# Patient Record
Sex: Male | Born: 1945 | Race: Black or African American | Hispanic: No | Marital: Married | Smoking: Former smoker
Health system: Southern US, Community
[De-identification: ages and names within clinical notes are randomized; demographics above are authoritative.]

## PROBLEM LIST (undated history)

## (undated) DIAGNOSIS — I10 Essential (primary) hypertension: Secondary | ICD-10-CM

## (undated) DIAGNOSIS — I2510 Atherosclerotic heart disease of native coronary artery without angina pectoris: Secondary | ICD-10-CM

## (undated) DIAGNOSIS — B9562 Methicillin resistant Staphylococcus aureus infection as the cause of diseases classified elsewhere: Secondary | ICD-10-CM

## (undated) DIAGNOSIS — I509 Heart failure, unspecified: Secondary | ICD-10-CM

## (undated) DIAGNOSIS — I252 Old myocardial infarction: Secondary | ICD-10-CM

## (undated) DIAGNOSIS — N400 Enlarged prostate without lower urinary tract symptoms: Secondary | ICD-10-CM

## (undated) DIAGNOSIS — R7881 Bacteremia: Secondary | ICD-10-CM

## (undated) HISTORY — PX: OTHER SURGICAL HISTORY: SHX169

---

## 2015-06-29 ENCOUNTER — Emergency Department: Payer: Medicare HMO

## 2015-06-29 ENCOUNTER — Inpatient Hospital Stay
Admission: EM | Admit: 2015-06-29 | Discharge: 2015-07-03 | DRG: 698 | Disposition: A | Discharge status: Home / Self Care | Payer: Medicare HMO | Attending: Internal Medicine | Admitting: Internal Medicine

## 2015-06-29 ENCOUNTER — Encounter: Payer: Self-pay | Admitting: *Deleted

## 2015-06-29 ENCOUNTER — Inpatient Hospital Stay: Payer: Medicare HMO

## 2015-06-29 DIAGNOSIS — I5042 Chronic combined systolic (congestive) and diastolic (congestive) heart failure: Secondary | ICD-10-CM

## 2015-06-29 DIAGNOSIS — I10 Essential (primary) hypertension: Secondary | ICD-10-CM | POA: Diagnosis present

## 2015-06-29 DIAGNOSIS — Z87891 Personal history of nicotine dependence: Secondary | ICD-10-CM | POA: Diagnosis not present

## 2015-06-29 DIAGNOSIS — Z7982 Long term (current) use of aspirin: Secondary | ICD-10-CM

## 2015-06-29 DIAGNOSIS — Z978 Presence of other specified devices: Secondary | ICD-10-CM

## 2015-06-29 DIAGNOSIS — R339 Retention of urine, unspecified: Secondary | ICD-10-CM | POA: Diagnosis present

## 2015-06-29 DIAGNOSIS — A4151 Sepsis due to Escherichia coli [E. coli]: Secondary | ICD-10-CM | POA: Diagnosis present

## 2015-06-29 DIAGNOSIS — R740 Nonspecific elevation of levels of transaminase and lactic acid dehydrogenase [LDH]: Secondary | ICD-10-CM | POA: Diagnosis present

## 2015-06-29 DIAGNOSIS — N401 Enlarged prostate with lower urinary tract symptoms: Secondary | ICD-10-CM | POA: Diagnosis present

## 2015-06-29 DIAGNOSIS — N179 Acute kidney failure, unspecified: Secondary | ICD-10-CM

## 2015-06-29 DIAGNOSIS — I248 Other forms of acute ischemic heart disease: Secondary | ICD-10-CM | POA: Diagnosis present

## 2015-06-29 DIAGNOSIS — I429 Cardiomyopathy, unspecified: Secondary | ICD-10-CM | POA: Diagnosis present

## 2015-06-29 DIAGNOSIS — J9600 Acute respiratory failure, unspecified whether with hypoxia or hypercapnia: Secondary | ICD-10-CM

## 2015-06-29 DIAGNOSIS — T83511A Infection and inflammatory reaction due to indwelling urethral catheter, initial encounter: Principal | ICD-10-CM | POA: Diagnosis present

## 2015-06-29 DIAGNOSIS — R6520 Severe sepsis without septic shock: Secondary | ICD-10-CM | POA: Diagnosis present

## 2015-06-29 DIAGNOSIS — N10 Acute tubulo-interstitial nephritis: Secondary | ICD-10-CM | POA: Diagnosis present

## 2015-06-29 DIAGNOSIS — Z888 Allergy status to other drugs, medicaments and biological substances status: Secondary | ICD-10-CM | POA: Diagnosis not present

## 2015-06-29 DIAGNOSIS — I959 Hypotension, unspecified: Secondary | ICD-10-CM | POA: Diagnosis present

## 2015-06-29 DIAGNOSIS — N390 Urinary tract infection, site not specified: Secondary | ICD-10-CM

## 2015-06-29 DIAGNOSIS — Z79899 Other long term (current) drug therapy: Secondary | ICD-10-CM

## 2015-06-29 DIAGNOSIS — E860 Dehydration: Secondary | ICD-10-CM | POA: Diagnosis present

## 2015-06-29 DIAGNOSIS — N400 Enlarged prostate without lower urinary tract symptoms: Secondary | ICD-10-CM

## 2015-06-29 DIAGNOSIS — R197 Diarrhea, unspecified: Secondary | ICD-10-CM | POA: Diagnosis present

## 2015-06-29 DIAGNOSIS — Z960 Presence of urogenital implants: Secondary | ICD-10-CM

## 2015-06-29 DIAGNOSIS — A419 Sepsis, unspecified organism: Secondary | ICD-10-CM | POA: Diagnosis present

## 2015-06-29 DIAGNOSIS — Z882 Allergy status to sulfonamides status: Secondary | ICD-10-CM | POA: Diagnosis not present

## 2015-06-29 DIAGNOSIS — N139 Obstructive and reflux uropathy, unspecified: Secondary | ICD-10-CM | POA: Diagnosis present

## 2015-06-29 DIAGNOSIS — N170 Acute kidney failure with tubular necrosis: Secondary | ICD-10-CM | POA: Diagnosis present

## 2015-06-29 DIAGNOSIS — R319 Hematuria, unspecified: Secondary | ICD-10-CM

## 2015-06-29 DIAGNOSIS — R338 Other retention of urine: Secondary | ICD-10-CM | POA: Diagnosis present

## 2015-06-29 HISTORY — DX: Essential (primary) hypertension: I10

## 2015-06-29 HISTORY — DX: Heart failure, unspecified: I50.9

## 2015-06-29 HISTORY — DX: Benign prostatic hyperplasia without lower urinary tract symptoms: N40.0

## 2015-06-29 LAB — URINALYSIS COMPLETE WITH MICROSCOPIC (ARMC ONLY)
BILIRUBIN URINE: NEGATIVE
GLUCOSE, UA: NEGATIVE mg/dL
NITRITE: NEGATIVE
PH: 5 (ref 5.0–8.0)
Protein, ur: >500 mg/dL — AB
Specific Gravity, Urine: 1.024 (ref 1.005–1.030)

## 2015-06-29 LAB — BLOOD CULTURE ID PANEL (REFLEXED)
ACINETOBACTER BAUMANNII: NOT DETECTED
CANDIDA PARAPSILOSIS: NOT DETECTED
CANDIDA TROPICALIS: NOT DETECTED
Candida albicans: NOT DETECTED
Candida glabrata: NOT DETECTED
Candida krusei: NOT DETECTED
Carbapenem resistance: NOT DETECTED
Enterobacter cloacae complex: NOT DETECTED
Enterobacteriaceae species: DETECTED — AB
Enterococcus species: NOT DETECTED
Escherichia coli: DETECTED — AB
HAEMOPHILUS INFLUENZAE: NOT DETECTED
KLEBSIELLA OXYTOCA: NOT DETECTED
KLEBSIELLA PNEUMONIAE: NOT DETECTED
Listeria monocytogenes: NOT DETECTED
METHICILLIN RESISTANCE: NOT DETECTED
Neisseria meningitidis: NOT DETECTED
PROTEUS SPECIES: NOT DETECTED
Pseudomonas aeruginosa: NOT DETECTED
SERRATIA MARCESCENS: NOT DETECTED
Staphylococcus aureus (BCID): NOT DETECTED
Staphylococcus species: NOT DETECTED
Streptococcus agalactiae: NOT DETECTED
Streptococcus pneumoniae: NOT DETECTED
Streptococcus pyogenes: NOT DETECTED
Streptococcus species: NOT DETECTED
VANCOMYCIN RESISTANCE: NOT DETECTED

## 2015-06-29 LAB — COMPREHENSIVE METABOLIC PANEL
ALBUMIN: 3.9 g/dL (ref 3.5–5.0)
ALT: 21 U/L (ref 17–63)
AST: 24 U/L (ref 15–41)
Alkaline Phosphatase: 70 U/L (ref 38–126)
Anion gap: 10 (ref 5–15)
BILIRUBIN TOTAL: 1 mg/dL (ref 0.3–1.2)
BUN: 31 mg/dL — AB (ref 6–20)
CO2: 24 mmol/L (ref 22–32)
Calcium: 9.2 mg/dL (ref 8.9–10.3)
Chloride: 104 mmol/L (ref 101–111)
Creatinine, Ser: 1.73 mg/dL — ABNORMAL HIGH (ref 0.61–1.24)
GFR calc Af Amer: 44 mL/min — ABNORMAL LOW (ref >60)
GFR calc non Af Amer: 38 mL/min — ABNORMAL LOW (ref >60)
GLUCOSE: 103 mg/dL — AB (ref 65–99)
POTASSIUM: 4.5 mmol/L (ref 3.5–5.1)
Sodium: 138 mmol/L (ref 135–145)
TOTAL PROTEIN: 7.7 g/dL (ref 6.5–8.1)

## 2015-06-29 LAB — CBC WITH DIFFERENTIAL/PLATELET
BASOS ABS: 0 10*3/uL (ref 0–0.1)
Basophils Relative: 0 %
EOS PCT: 2 %
Eosinophils Absolute: 0.1 10*3/uL (ref 0–0.7)
HCT: 48.8 % (ref 40.0–52.0)
Hemoglobin: 16.4 g/dL (ref 13.0–18.0)
LYMPHS ABS: 0.7 10*3/uL — AB (ref 1.0–3.6)
LYMPHS PCT: 16 %
MCH: 29.7 pg (ref 26.0–34.0)
MCHC: 33.6 g/dL (ref 32.0–36.0)
MCV: 88.6 fL (ref 80.0–100.0)
MONO ABS: 0 10*3/uL — AB (ref 0.2–1.0)
Monocytes Relative: 1 %
Neutro Abs: 3.8 10*3/uL (ref 1.4–6.5)
Neutrophils Relative %: 81 %
PLATELETS: 220 10*3/uL (ref 150–440)
RBC: 5.5 MIL/uL (ref 4.40–5.90)
RDW: 14.4 % (ref 11.5–14.5)
WBC: 4.7 10*3/uL (ref 3.8–10.6)

## 2015-06-29 LAB — LACTIC ACID, PLASMA
LACTIC ACID, VENOUS: 5.7 mmol/L — AB (ref 0.5–2.0)
Lactic Acid, Venous: 3.9 mmol/L (ref 0.5–2.0)
Lactic Acid, Venous: 4.2 mmol/L (ref 0.5–2.0)
Lactic Acid, Venous: 5.2 mmol/L (ref 0.5–2.0)

## 2015-06-29 LAB — PROTIME-INR
INR: 1.11
Prothrombin Time: 14.5 seconds (ref 11.4–15.0)

## 2015-06-29 LAB — LIPASE, BLOOD: LIPASE: 25 U/L (ref 11–51)

## 2015-06-29 LAB — TROPONIN I: TROPONIN I: 0.04 ng/mL — AB (ref <0.031)

## 2015-06-29 LAB — APTT

## 2015-06-29 MED ORDER — SODIUM CHLORIDE 0.9 % IV BOLUS (SEPSIS)
1000.0000 mL | Freq: Once | INTRAVENOUS | Status: AC
  Administered 2015-06-29: 1000 mL via INTRAVENOUS

## 2015-06-29 MED ORDER — SODIUM CHLORIDE 0.9 % IV BOLUS (SEPSIS)
1000.0000 mL | INTRAVENOUS | Status: AC
  Administered 2015-06-29 (×2): 1000 mL via INTRAVENOUS

## 2015-06-29 MED ORDER — SODIUM CHLORIDE 0.9 % IV BOLUS (SEPSIS)
1000.0000 mL | Freq: Once | INTRAVENOUS | Status: AC
Start: 2015-06-29 — End: 2015-06-29
  Administered 2015-06-29: 1000 mL via INTRAVENOUS

## 2015-06-29 MED ORDER — SODIUM CHLORIDE 0.9 % IV BOLUS (SEPSIS)
1000.0000 mL | INTRAVENOUS | Status: AC
  Administered 2015-06-29 (×3): 1000 mL via INTRAVENOUS

## 2015-06-29 MED ORDER — PIPERACILLIN-TAZOBACTAM 3.375 G IVPB: 3.3750 g | Freq: Three times a day (TID) | INTRAVENOUS | Status: DC

## 2015-06-29 MED ORDER — DEXTROSE 5 % IV SOLN: 2.0000 g | Freq: Once | INTRAVENOUS | Status: DC

## 2015-06-29 MED ORDER — VANCOMYCIN HCL 10 G IV SOLR
1500.0000 mg | Freq: Once | INTRAVENOUS | Status: AC
  Administered 2015-06-29: 1500 mg via INTRAVENOUS
  Filled 2015-06-29: qty 1500

## 2015-06-29 MED ORDER — ACETAMINOPHEN 500 MG PO TABS
1000.0000 mg | ORAL_TABLET | Freq: Once | ORAL | Status: AC
  Administered 2015-06-29: 1000 mg via ORAL
  Filled 2015-06-29: qty 2

## 2015-06-29 MED ORDER — TAMSULOSIN HCL 0.4 MG PO CAPS
0.4000 mg | ORAL_CAPSULE | Freq: Every day | ORAL | Status: DC
  Administered 2015-06-29 – 2015-07-03 (×5): 0.4 mg via ORAL
  Filled 2015-06-29 (×5): qty 1

## 2015-06-29 MED ORDER — PIPERACILLIN-TAZOBACTAM 3.375 G IVPB
3.3750 g | Freq: Once | INTRAVENOUS | Status: AC
  Administered 2015-06-29: 3.375 g via INTRAVENOUS
  Filled 2015-06-29: qty 50

## 2015-06-29 MED ORDER — SODIUM CHLORIDE 0.9 % IV SOLN
2.0000 g | Freq: Two times a day (BID) | INTRAVENOUS | Status: DC
  Administered 2015-06-29 – 2015-07-01 (×4): 2 g via INTRAVENOUS
  Filled 2015-06-29 (×6): qty 2

## 2015-06-29 MED ORDER — DEXTROSE 5 % IV SOLN
1.0000 g | INTRAVENOUS | Status: AC
  Administered 2015-06-29: 1 g via INTRAVENOUS
  Filled 2015-06-29: qty 10

## 2015-06-29 MED ORDER — ASPIRIN EC 81 MG PO TBEC
81.0000 mg | DELAYED_RELEASE_TABLET | Freq: Every day | ORAL | Status: DC
Start: 2015-06-29 — End: 2015-07-03
  Administered 2015-06-29 – 2015-07-03 (×5): 81 mg via ORAL
  Filled 2015-06-29 (×5): qty 1

## 2015-06-29 MED ORDER — ONDANSETRON HCL 4 MG/2ML IJ SOLN
INTRAMUSCULAR | Status: AC
  Administered 2015-06-29: 4 mg via INTRAVENOUS
  Filled 2015-06-29: qty 2

## 2015-06-29 MED ORDER — ADULT MULTIVITAMIN W/MINERALS CH
1.0000 | ORAL_TABLET | Freq: Every day | ORAL | Status: DC
  Administered 2015-06-29 – 2015-07-03 (×5): 1 via ORAL
  Filled 2015-06-29 (×5): qty 1

## 2015-06-29 MED ORDER — PHENYLEPHRINE HCL 10 MG/ML IJ SOLN
0.0000 ug/min | INTRAVENOUS | Status: DC
  Filled 2015-06-29 (×2): qty 1

## 2015-06-29 MED ORDER — DOCUSATE SODIUM 100 MG PO CAPS
100.0000 mg | ORAL_CAPSULE | Freq: Two times a day (BID) | ORAL | Status: DC
  Administered 2015-06-29: 100 mg via ORAL
  Filled 2015-06-29: qty 1

## 2015-06-29 MED ORDER — IBUPROFEN 800 MG PO TABS
800.0000 mg | ORAL_TABLET | Freq: Once | ORAL | Status: AC
  Administered 2015-06-29: 800 mg via ORAL
  Filled 2015-06-29: qty 1

## 2015-06-29 MED ORDER — SODIUM CHLORIDE 0.9 % IV SOLN
INTRAVENOUS | Status: DC
  Administered 2015-06-29 – 2015-07-01 (×3): via INTRAVENOUS

## 2015-06-29 MED ORDER — ACETAMINOPHEN 650 MG RE SUPP
650.0000 mg | Freq: Four times a day (QID) | RECTAL | Status: DC | PRN
Start: 2015-06-29 — End: 2015-07-03

## 2015-06-29 MED ORDER — ONDANSETRON HCL 4 MG/2ML IJ SOLN
INTRAMUSCULAR | Status: AC
  Filled 2015-06-29: qty 2

## 2015-06-29 MED ORDER — PIPERACILLIN-TAZOBACTAM 3.375 G IVPB 30 MIN
3.3750 g | Freq: Once | INTRAVENOUS | Status: AC
  Administered 2015-06-29: 3.375 g via INTRAVENOUS
  Filled 2015-06-29: qty 50

## 2015-06-29 MED ORDER — ONDANSETRON HCL 4 MG PO TABS: 4.0000 mg | ORAL_TABLET | Freq: Four times a day (QID) | ORAL | Status: DC | PRN

## 2015-06-29 MED ORDER — DEXTROSE 5 % IV SOLN
1.0000 g | Freq: Once | INTRAVENOUS | Status: AC
  Administered 2015-06-29: 1 g via INTRAVENOUS
  Filled 2015-06-29: qty 10

## 2015-06-29 MED ORDER — SODIUM CHLORIDE 0.9 % IJ SOLN
3.0000 mL | Freq: Two times a day (BID) | INTRAMUSCULAR | Status: DC
  Administered 2015-06-29 – 2015-07-03 (×8): 3 mL via INTRAVENOUS

## 2015-06-29 MED ORDER — ENOXAPARIN SODIUM 40 MG/0.4ML ~~LOC~~ SOLN
40.0000 mg | SUBCUTANEOUS | Status: DC
  Administered 2015-06-29: 40 mg via SUBCUTANEOUS
  Filled 2015-06-29: qty 0.4

## 2015-06-29 MED ORDER — ONDANSETRON HCL 4 MG/2ML IJ SOLN
4.0000 mg | Freq: Once | INTRAMUSCULAR | Status: AC
  Administered 2015-06-29: 4 mg via INTRAVENOUS

## 2015-06-29 MED ORDER — ONDANSETRON HCL 4 MG/2ML IJ SOLN: 4.0000 mg | Freq: Four times a day (QID) | INTRAMUSCULAR | Status: DC | PRN

## 2015-06-29 MED ORDER — VANCOMYCIN HCL 10 G IV SOLR
1250.0000 mg | INTRAVENOUS | Status: DC
  Filled 2015-06-29: qty 1250

## 2015-06-29 MED ORDER — ACETAMINOPHEN 325 MG PO TABS: 650.0000 mg | ORAL_TABLET | Freq: Four times a day (QID) | ORAL | Status: DC | PRN

## 2015-06-29 MED ORDER — SODIUM CHLORIDE 0.9 % IV SOLN
1500.0000 mg | Freq: Two times a day (BID) | INTRAVENOUS | Status: DC
Start: 2015-06-29 — End: 2015-06-29
  Filled 2015-06-29: qty 1500

## 2015-06-29 NOTE — Progress Notes (Addendum)
ANTIBIOTIC CONSULT NOTE - INITIAL  Pharmacy Consult for ceftriaxone Indication: UTI  Allergies  Allergen Reactions  . Bactrim [Sulfamethoxazole-Trimethoprim] Hives  . Sulfa Antibiotics Hives    Patient Measurements: Height:  (190.5 cm) Weight: 210 lb (95.255 kg) IBW/kg (Calculated) : 84.5 Adjusted Body Weight:   Vital Signs: Temp: 101.1 F (38.4 C) (01/19 0243) Temp Source: Oral (01/19 0243) BP: 115/68 mmHg (01/19 0243) Pulse Rate: 115 (01/19 0251) Intake/Output from previous day:   Intake/Output from this shift:    Labs: No results for input(s): WBC, HGB, PLT, LABCREA, CREATININE in the last 72 hours. CrCl cannot be calculated (Patient has no serum creatinine result on file.). No results for input(s): VANCOTROUGH, VANCOPEAK, VANCORANDOM, GENTTROUGH, GENTPEAK, GENTRANDOM, TOBRATROUGH, TOBRAPEAK, TOBRARND, AMIKACINPEAK, AMIKACINTROU, AMIKACIN in the last 72 hours.   Microbiology: No results found for this or any previous visit (from the past 720 hour(s)).  Medical History: Past Medical History  Diagnosis Date  . Hypertension   . BPH (benign prostatic hyperplasia)   . CHF (congestive heart failure) (HCC)     Medications:  Infusions:  . cefTRIAXone (ROCEPHIN)  IV    . sodium chloride     Assessment: 69 yom with fever N/V. Pharmacy consulted to dose ceftriaxone for UTI.   Goal of Therapy:    Plan:  Ceftriaxone 1 gm IV Q24H.   Carola Frost, Pharm.D., BCPS Clinical Pharmacist 06/29/2015,3:07 AM   Update [phone] 06/29/2015    Order to start Zosyn and vancomycin for sepsis received. Vd 62.2 L, Ke 0.045 hr-1, T1/2 15.3 hr. Zosyn 3.375 gm IV Q8H EI and vancomycin 1.5 gm IV Q18H with stacked dosing, second dose approximately 12 hours after first, predicted trough 19 mcg/mL. Pharmacy will continue to follow and adjust as needed to maintain trough 15 to 20 mcg/mL.

## 2015-06-29 NOTE — Progress Notes (Signed)
Pharmacy Antibiotic Follow-up Note  Casey Murphy is a 70 y.o. year-old male admitted on 06/29/2015.  The patient is currently on day 1 of vancomycin and Zosyn for UTI/sepsis.  Assessment/Plan: After discussion with Dr. Cherlynn Kaiser, BCID results were discussed and patient will be narrowed to meropenem. Further narrowing will be determined based on finalized susceptibilities.   Will order meropenem 2 g IV q12h (renally adjusted from meropenem 2 g IV q8h per [employer] protocol).    Temp (24hrs), Avg:101.6 F (38.7 C), Min:99.4 F (37.4 C), Max:103.2 F (39.6 C)   Recent Labs Lab 06/29/15 0301  WBC 4.7    Recent Labs Lab 06/29/15 0301  CREATININE 1.73*   Estimated Creatinine Clearance: 46.8 mL/min (by C-G formula based on Cr of 1.73).    Allergies  Allergen Reactions  . Lisinopril Other (See Comments)    Reaction: unknown  . Sulfa Antibiotics Hives    Antimicrobials this admission: Vancomycin/Zosyn 1/19>>1/19 Meropenem 1/19>>    Microbiology results: 1/19 BCx: GNR/Ecoli 1/19 UCx: pending  Thank you for allowing pharmacy to be a part of this patient's care.  Marty Heck PharmD 06/29/2015 3:11 PM

## 2015-06-29 NOTE — Progress Notes (Addendum)
eLink Physician-Brief Progress Note Patient Name: Casey Murphy DOB: 17-Jul-1945 MRN: [id number]   Date of Service  06/29/2015  HPI/Events of Note  70 yo male with PMH of HTN, Systolic CHF, BPH. Admitted with cystitis and sepsis. Gram negative rods in urine. Lactic Acid 4.2 >> 5.7. Current medical regimen includes Meropenem, ASA, Colace, MVI and Flomax. Already given a Fluid bolus for the hypotension and elevated Lactic Acid. Management per the Hospitalist.   eICU Interventions  Continue present management. Trend BP and Lactic Acid levels.      Intervention Category Evaluation Type: New Patient Evaluation  Lenell Antu 06/29/2015, 8:01 PM

## 2015-06-29 NOTE — Progress Notes (Signed)
[facility] [place] at [facility]   PATIENT NAME: Casey Murphy    MR#:  [id number]  DATE OF BIRTH:  1945-08-11  SUBJECTIVE:   Patient here due to fever, hypotension and noted to be septic secondary to UTI. Complaining of some abdominal bloating and fullness. Blood pressure still on the low side. Patient's blood, urine cultures are positive for gram-negative rod.  REVIEW OF SYSTEMS:    Review of Systems  Constitutional: Negative for fever and chills.  HENT: Negative for congestion and tinnitus.   Eyes: Negative for blurred vision and double vision.  Respiratory: Negative for cough, shortness of breath and wheezing.   Cardiovascular: Negative for chest pain, orthopnea and PND.  Gastrointestinal: Negative for nausea, vomiting, abdominal pain and diarrhea.  Genitourinary: Negative for dysuria and hematuria.  Neurological: Positive for weakness (generalized). Negative for dizziness, sensory change and focal weakness.  All other systems reviewed and are negative.   Nutrition: Heart healthy Tolerating Diet: Yes Tolerating PT: Await evaluation   DRUG ALLERGIES:   Allergies  Allergen Reactions  . Lisinopril Other (See Comments)    Reaction: unknown  . Sulfa Antibiotics Hives    VITALS:  Blood pressure 81/58, pulse 102, temperature 98.1 F (36.7 C), temperature source Oral, resp. rate 26, height  (1.905 m), weight 95.255 kg (210 lb), SpO2 94 %.  PHYSICAL EXAMINATION:   Physical Exam  GENERAL:  70 y.o.-year-old patient lying in the bed with no acute distress.  EYES: Pupils equal, round, reactive to light and accommodation. No scleral icterus. Extraocular muscles intact.  HEENT: Head atraumatic, normocephalic. Oropharynx and nasopharynx clear.  NECK:  Supple, no jugular venous distention. No thyroid enlargement, no tenderness.  LUNGS: Normal breath sounds bilaterally, no wheezing, rales, rhonchi. No use of accessory muscles of respiration.   CARDIOVASCULAR: S1, S2 normal. No murmurs, rubs, or gallops.  ABDOMEN: Soft, nontender, nondistended. Bowel sounds present. No organomegaly or mass.  EXTREMITIES: No cyanosis, clubbing or edema b/l.    NEUROLOGIC: Cranial nerves II through XII are intact. No focal Motor or sensory deficits b/l.   PSYCHIATRIC: The patient is alert and oriented x 3.  SKIN: No obvious rash, lesion, or ulcer.   Foley catheter in place with tea colored Urine draining.  LABORATORY PANEL:   CBC  Recent Labs Lab 06/29/15 0301  WBC 4.7  HGB 16.4  HCT 48.8  PLT 220   ------------------------------------------------------------------------------------------------------------------  Chemistries   Recent Labs Lab 06/29/15 0301  NA 138  K 4.5  CL 104  CO2 24  GLUCOSE 103*  BUN 31*  CREATININE 1.73*  CALCIUM 9.2  AST 24  ALT 21  ALKPHOS 70  BILITOT 1.0   ------------------------------------------------------------------------------------------------------------------  Cardiac Enzymes  Recent Labs Lab 06/29/15 0301  TROPONINI 0.04*   ------------------------------------------------------------------------------------------------------------------  RADIOLOGY:  Dg Chest Port 1 View  06/29/2015  CLINICAL DATA:  Acute onset of fever, nausea and vomiting. Recent Foley catheter placement. Initial encounter. EXAM: PORTABLE CHEST 1 VIEW COMPARISON:  None. FINDINGS: The lungs are well-aerated. Mild bibasilar atelectasis is noted. There is no evidence of pleural effusion or pneumothorax. The cardiomediastinal silhouette is within normal limits. No acute osseous abnormalities are seen. IMPRESSION: Mild bibasilar atelectasis noted.  Lungs otherwise clear. Electronically Signed   By: Roanna Raider M.D.   On: 06/29/2015 03:16   Ct Renal Stone Study  06/29/2015  CLINICAL DATA:  70 year old male with urinary retention and sepsis and hematuria. EXAM: CT ABDOMEN AND PELVIS WITHOUT CONTRAST TECHNIQUE:  Multidetector CT  imaging of the abdomen and pelvis was performed following the standard protocol without IV contrast. COMPARISON:  None. FINDINGS: Evaluation of this exam is limited in the absence of intravenous contrast. Minimal bibasilar dependent atelectatic changes. The visualized lung bases are otherwise clear. No intra-abdominal free air or free fluid. Multiple hepatic hypodense lesions measuring up to 1.8 cm in the left lobe of the liver. This lesions are incompletely characterized but may represent or hemangioma. Nonemergent MRI or dedicated multiphasic contrast enhanced CT is recommended for further characterization. The gallbladder, pancreas, spleen, and the right adrenal gland appear unremarkable. There is a 12 mm indeterminate left adrenal nodule, likely an adenoma. There is no hydronephrosis on either side. Punctate nonobstructing right renal calculus may be present. Subcentimeter ill-defined left renal parenchymal hypodense lesion is not characterized on this noncontrast study but may represent cysts. Mild nonspecific bilateral perinephric stranding noted. Correlation with urinalysis recommended to exclude UTI. The visualized ureters appear unremarkable. The urinary bladder is decompressed around a Foley catheter. The prostate gland is enlarged measuring 8.6 cm in transverse diameter. There are scattered sigmoid diverticula without active inflammatory changes. Moderate stool throughout the colon. No evidence of bowel obstruction or inflammation. Normal appendix. There is aortoiliac atherosclerotic disease. There is a 2.6 cm infrarenal aortic ectasia. No portal venous gas identified. There is no adenopathy. The abdominal wall soft tissues appear unremarkable. There is degenerative changes of the spine. No acute fracture. IMPRESSION: No hydronephrosis or obstructing stone. Correlation with urinalysis recommended to exclude UTI. Enlarged prostate gland. No evidence of bowel obstruction or inflammation.   Normal appendix. Sigmoid diverticulosis. Small scattered hepatic hypodense lesions. Electronically Signed   By: Elgie Collard M.D.   On: 06/29/2015 04:20     ASSESSMENT AND PLAN:   70 yo male w/ hx of HTN, hx of systolic CHF, BPH w/ urinary retention who presented to the hospital w/ fever and noted to have sepsis secondary to UTI.  #1 sepsis-patient presented with fever, tachycardia, hypotension, positive urinalysis and elevated lactic acid. -Continue aggressive IV fluids, IV meropenem, patient's blood and urine cultures are positive for gram-negative rod and will follow-up identification.  #2 UTI-this is the cause of patient's sepsis. -Continue IV meropenem, follow urine, blood cultures.  #3 hypotension-secondary to the sepsis and UTI. -Continue IV fluids and follow hemodynamics. Patient has a history of CHF and therefore will need to be careful with IV fluid hydration.  #4 history of BPH with urinary retention-patient presented to the hospital with a chronic indwelling Foley. -I will get a urology consult. Continue Flomax.  #5 history of CHF-the patient is not in congestive heart failure presently. -Hold beta blockers given the hypotension, hold diuretics.  #6 elevated troponin-likely secondary to demand ischemia from hypotension and sepsis. -Follow serial cardiac markers.     All the records are reviewed and case discussed with Care Management/Social Workerr. Management plans discussed with the patient, family and they are in agreement.  CODE STATUS: Full  DVT Prophylaxis: Lovenox  TOTAL TIME TAKING CARE OF THIS PATIENT: 30 minutes.   POSSIBLE D/C IN 2-3 DAYS, DEPENDING ON CLINICAL CONDITION.   Houston Siren M.D on 06/29/2015 at 5:14 PM  Between 7am to 6pm - Pager - [phone]  After 6pm go to [email or web address] - password [email or web address] [facility]  [facility]  Office  [phone]  CC: Primary care physician; No primary care provider on file.

## 2015-06-29 NOTE — H&P (Addendum)
[facility] - [place] at [facility]   PATIENT NAME: Casey Murphy    MR#:  [id number]  DATE OF BIRTH:  05-25-1946  DATE OF ADMISSION:  06/29/2015  PRIMARY CARE PHYSICIAN: No primary care provider on file.   REQUESTING/REFERRING PHYSICIAN:   CHIEF COMPLAINT:   Chief Complaint  Patient presents with  . Fever  . Code Sepsis    HISTORY OF PRESENT ILLNESS: Casey Murphy  is a 70 y.o. male with a known history of benign prostatic hypertrophy, hypertension, congestive heart failure presented to the emergency room with fever. Patient had a fever of 102.74F and he presented to the ER. Her had some chills and low back pain. [facility] patient had a Foley catheter placed 4 days ago for urinary obstruction secondary to prostate hypertrophy. Patient was admitted to the emergency room and was found to be septic and was started on IV antibiotics and IV fluids. He was also hypotensive and received 3 L of IV fluid hydration. No history of any chest pain. No complaints of any shortness of breath. No headache dizziness or blurry vision. Has generalized weakness. Initially when patient presented to the emergency room he was more lethargic and later on he became more alert and awake and responding to all verbal commands.  PAST MEDICAL HISTORY:   Past Medical History  Diagnosis Date  . Hypertension   . BPH (benign prostatic hyperplasia)   . CHF (congestive heart failure) (HCC)     PAST SURGICAL HISTORY: Past Surgical History  Procedure Laterality Date  . None      SOCIAL HISTORY:  Social History  Substance Use Topics  . Smoking status: Former Smoker    Types: Cigarettes    Quit date: 05/29/2015  . Smokeless tobacco: Never Used  . Alcohol Use: 0.0 oz/week    0 Standard drinks or equivalent per week     Comment: wine occasionally    FAMILY HISTORY: History reviewed. No pertinent family history.  DRUG ALLERGIES:   Allergies  Allergen Reactions  . Lisinopril Other (See Comments)    Reaction: unknown  . Sulfa Antibiotics Hives    REVIEW OF SYSTEMS:   CONSTITUTIONAL: Has fever, fatigue and weakness.  EYES: No blurred or double vision.  EARS, NOSE, AND THROAT: No tinnitus or ear pain.  RESPIRATORY: No cough, shortness of breath, wheezing or hemoptysis.  CARDIOVASCULAR: No chest pain, orthopnea, edema.  GASTROINTESTINAL: No nausea, vomiting, diarrhea or abdominal pain.  GENITOURINARY: Has dysuria, hematuria.  ENDOCRINE: No polyuria, nocturia,  HEMATOLOGY: No anemia, easy bruising or bleeding SKIN: No rash or lesion. MUSCULOSKELETAL: No joint pain or arthritis. Low back pain present  NEUROLOGIC: No tingling, numbness, weakness.  PSYCHIATRY: No anxiety or depression.   MEDICATIONS AT HOME:  Prior to Admission medications   Medication Sig Start Date End Date Taking? Authorizing Provider  aspirin EC 81 MG tablet Take 81 mg by mouth daily.   Yes Historical Provider, MD  losartan (COZAAR) 100 MG tablet Take 100 mg by mouth daily.   Yes Historical Provider, MD  metoprolol succinate (TOPROL-XL) 50 MG 24 hr tablet Take 50 mg by mouth daily. Take with or immediately following a meal.   Yes Historical Provider, MD  Multiple Vitamin (MULTIVITAMIN WITH MINERALS) TABS tablet Take 1 tablet by mouth daily.   Yes Historical Provider, MD  tamsulosin (FLOMAX) 0.4 MG CAPS capsule Take 0.4 mg by mouth daily.   Yes Historical Provider, MD  PHYSICAL EXAMINATION:   VITAL SIGNS: Blood pressure 103/60, pulse 117, temperature 102.7 F (39.3 C), temperature source Oral, resp. rate 18, height  (1.905 m), weight 95.255 kg (210 lb), SpO2 94 %.  GENERAL:  70 y.o.-year-old patient lying in the bed with no acute distress.  EYES: Pupils equal, round, reactive to light and accommodation. No scleral icterus. Extraocular muscles intact.  HEENT: Head atraumatic, normocephalic. Oropharynx dry and nasopharynx clear.   NECK:  Supple, no jugular venous distention. No thyroid enlargement, no tenderness.  LUNGS: Normal breath sounds bilaterally, no wheezing, rales,rhonchi or crepitation. No use of accessory muscles of respiration.  CARDIOVASCULAR: S1, S2 tachycardia noted. No murmurs, rubs, or gallops.  ABDOMEN: Soft, nontender, nondistended. Bowel sounds present. No organomegaly or mass. Foley noted. EXTREMITIES: No pedal edema, cyanosis, or clubbing.  NEUROLOGIC: Cranial nerves II through XII are intact. Muscle strength 5/5 in all extremities. Sensation intact. Gait not checked.  PSYCHIATRIC: The patient is alert and oriented x 3.  SKIN: No obvious rash, lesion, or ulcer.   LABORATORY PANEL:   CBC  Recent Labs Lab 06/29/15 0301  WBC 4.7  HGB 16.4  HCT 48.8  PLT 220  MCV 88.6  MCH 29.7  MCHC 33.6  RDW 14.4  LYMPHSABS 0.7*  MONOABS 0.0*  EOSABS 0.1  BASOSABS 0.0   ------------------------------------------------------------------------------------------------------------------  Chemistries   Recent Labs Lab 06/29/15 0301  NA 138  K 4.5  CL 104  CO2 24  GLUCOSE 103*  BUN 31*  CREATININE 1.73*  CALCIUM 9.2  AST 24  ALT 21  ALKPHOS 70  BILITOT 1.0   ------------------------------------------------------------------------------------------------------------------ estimated creatinine clearance is 46.8 mL/min (by C-G formula based on Cr of 1.73). ------------------------------------------------------------------------------------------------------------------ No results for input(s): TSH, T4TOTAL, T3FREE, THYROIDAB in the last 72 hours.  Invalid input(s): FREET3   Coagulation profile  Recent Labs Lab 06/29/15 0301  INR 1.11   ------------------------------------------------------------------------------------------------------------------- No results for input(s): DDIMER in the last 72  hours. -------------------------------------------------------------------------------------------------------------------  Cardiac Enzymes  Recent Labs Lab 06/29/15 0301  TROPONINI 0.04*   ------------------------------------------------------------------------------------------------------------------ Invalid input(s): POCBNP  ---------------------------------------------------------------------------------------------------------------  Urinalysis    Component Value Date/Time   COLORURINE AMBER* 06/29/2015 0329   APPEARANCEUR CLOUDY* 06/29/2015 0329   LABSPEC 1.024 06/29/2015 0329   PHURINE 5.0 06/29/2015 0329   GLUCOSEU NEGATIVE 06/29/2015 0329   HGBUR 3+* 06/29/2015 0329   BILIRUBINUR NEGATIVE 06/29/2015 0329   KETONESUR TRACE* 06/29/2015 0329   PROTEINUR >500* 06/29/2015 0329   NITRITE NEGATIVE 06/29/2015 0329   LEUKOCYTESUR 3+* 06/29/2015 0329     RADIOLOGY: Dg Chest Port 1 View  06/29/2015  CLINICAL DATA:  Acute onset of fever, nausea and vomiting. Recent Foley catheter placement. Initial encounter. EXAM: PORTABLE CHEST 1 VIEW COMPARISON:  None. FINDINGS: The lungs are well-aerated. Mild bibasilar atelectasis is noted. There is no evidence of pleural effusion or pneumothorax. The cardiomediastinal silhouette is within normal limits. No acute osseous abnormalities are seen. IMPRESSION: Mild bibasilar atelectasis noted.  Lungs otherwise clear. Electronically Signed   By: Roanna Raider M.D.   On: 06/29/2015 03:16   Ct Renal Stone Study  06/29/2015  CLINICAL DATA:  70 year old male with urinary retention and sepsis and hematuria. EXAM: CT ABDOMEN AND PELVIS WITHOUT CONTRAST TECHNIQUE: Multidetector CT imaging of the abdomen and pelvis was performed following the standard protocol without IV contrast. COMPARISON:  None. FINDINGS: Evaluation of this exam is limited in the absence of intravenous contrast. Minimal bibasilar dependent atelectatic changes. The visualized lung  bases are otherwise clear. No intra-abdominal  free air or free fluid. Multiple hepatic hypodense lesions measuring up to 1.8 cm in the left lobe of the liver. This lesions are incompletely characterized but may represent or hemangioma. Nonemergent MRI or dedicated multiphasic contrast enhanced CT is recommended for further characterization. The gallbladder, pancreas, spleen, and the right adrenal gland appear unremarkable. There is a 12 mm indeterminate left adrenal nodule, likely an adenoma. There is no hydronephrosis on either side. Punctate nonobstructing right renal calculus may be present. Subcentimeter ill-defined left renal parenchymal hypodense lesion is not characterized on this noncontrast study but may represent cysts. Mild nonspecific bilateral perinephric stranding noted. Correlation with urinalysis recommended to exclude UTI. The visualized ureters appear unremarkable. The urinary bladder is decompressed around a Foley catheter. The prostate gland is enlarged measuring 8.6 cm in transverse diameter. There are scattered sigmoid diverticula without active inflammatory changes. Moderate stool throughout the colon. No evidence of bowel obstruction or inflammation. Normal appendix. There is aortoiliac atherosclerotic disease. There is a 2.6 cm infrarenal aortic ectasia. No portal venous gas identified. There is no adenopathy. The abdominal wall soft tissues appear unremarkable. There is degenerative changes of the spine. No acute fracture. IMPRESSION: No hydronephrosis or obstructing stone. Correlation with urinalysis recommended to exclude UTI. Enlarged prostate gland. No evidence of bowel obstruction or inflammation.  Normal appendix. Sigmoid diverticulosis. Small scattered hepatic hypodense lesions. Electronically Signed   By: Elgie Collard M.D.   On: 06/29/2015 04:20    EKG: Orders placed or performed during the hospital encounter of 06/29/15  . EKG 12-Lead  . EKG 12-Lead  . EKG 12-Lead  .  EKG 12-Lead    IMPRESSION AND PLAN: 70 year old male patient with history of prostate hypertrophy, congestive heart failure presented to the emergency room with fever and low blood pressure. Patient has the indwelling Foley catheter which was placed 4 days ago for urinary obstruction secondary to prostate hypertrophy. Admitting diagnosis 1. Sepsis 2. Urinary tract infection 3. Hypotension 4. Dehydration 5. Prostate hypertrophy Treatment plan Admit patient to stepdown unit IV fluid resuscitation according to sepsis protocol IV vancomycin and IV Zosyn antibiotics and follow-up cultures Hold hypertensive medication Monitor electrolytes. IV pressors if no response to IV fluids.  All the records are reviewed and case discussed with ED provider. Management plans discussed with the patient, family and they are in agreement.  CODE STATUS:FULL    Code Status Orders        Start     Ordered   06/29/15 0601  Full code   Continuous     06/29/15 0602    Code Status History    Date Active Date Inactive Code Status Order ID Comments User Context   This patient has a current code status but no historical code status.       TOTAL CRITICAL CARETIME TAKING CARE OF THIS PATIENT: 52 minutes.    Ihor Austin M.D on 06/29/2015 at 6:04 AM  Between 7am to 6pm - Pager - [phone]  After 6pm go to [email or web address] - password [email or web address] [facility]  [facility]  Office  [phone]  CC: Primary care physician; No primary care provider on file.

## 2015-06-29 NOTE — ED Notes (Signed)
Attempted to call report to the CCU, Mardene Celeste, RN is on the other line and will call back for report.

## 2015-06-29 NOTE — Progress Notes (Signed)
Received call from bedside RN regarding pts climbing lactic acid, now 5.7. Pt is slightly hypotensive 75/64 (70), pt alert and oriented, normal mentation. Pt has received 4L NS so far. Information passed to [facility] MD for further evaluation if needed. Will continue to monitor.

## 2015-06-29 NOTE — Progress Notes (Signed)
ANTIBIOTIC CONSULT NOTE - Follow up Pharmacy Consult for Vancomycin and Zosyn Indication: rule out sepsis/UTI  Allergies  Allergen Reactions  . Lisinopril Other (See Comments)    Reaction: unknown  . Sulfa Antibiotics Hives    Patient Measurements: Height:  (190.5 cm) Weight: 210 lb (95.255 kg) IBW/kg (Calculated) : 84.5   Vital Signs: Temp: 99.4 F (37.4 C) (01/19 0710) Temp Source: Oral (01/19 0710) BP: 91/67 mmHg (01/19 1245) Pulse Rate: 114 (01/19 1245) Intake/Output from previous day:   Intake/Output from this shift:    Labs:  Recent Labs  06/29/15 0301  WBC 4.7  HGB 16.4  PLT 220  CREATININE 1.73*   Estimated Creatinine Clearance: 46.8 mL/min (by C-G formula based on Cr of 1.73). No results for input(s): VANCOTROUGH, VANCOPEAK, VANCORANDOM, GENTTROUGH, GENTPEAK, GENTRANDOM, TOBRATROUGH, TOBRAPEAK, TOBRARND, AMIKACINPEAK, AMIKACINTROU, AMIKACIN in the last 72 hours.   Microbiology: Recent Results (from the past 720 hour(s))  Culture, blood (routine x 2)     Status: None (Preliminary result)   Collection Time: 06/29/15  3:00 AM  Result Value Ref Range Status   Specimen Description BLOOD RIGHT ANTECUBITAL  Final   Special Requests BOTTLES DRAWN AEROBIC AND ANAEROBIC  Final   Culture NO GROWTH < 12 HOURS  Final   Report Status PENDING  Incomplete  Culture, blood (routine x 2)     Status: None (Preliminary result)   Collection Time: 06/29/15  3:01 AM  Result Value Ref Range Status   Specimen Description BLOOD LEFT ANTECUBITAL  Final   Special Requests BOTTLES DRAWN AEROBIC AND ANAEROBIC  Final   Culture NO GROWTH < 12 HOURS  Final   Report Status PENDING  Incomplete  Urine culture     Status: None (Preliminary result)   Collection Time: 06/29/15  3:29 AM  Result Value Ref Range Status   Specimen Description URINE, RANDOM  Final   Special Requests NONE  Final   Culture NO GROWTH < 12 HOURS  Final   Report Status PENDING  Incomplete     Assessment: 70 yo male here with sepsis and fever, suspecting UTI. Pt received vanc 1500 mg IV x1 in the ED at 0420.  ke 0.043, half life 16.1 h, vd 66.7 L; expected Cmin ~16.4  Goal of Therapy:  Vancomycin trough level 15-20 mcg/ml  Plan:  Will continue dosing with vancomycin 1250 mg IV q18h Trough before 4th dose - 1/21 at 2100 Will need to continue to follow renal function and culture results.  Will continue Zosyn 3.375 g IV q8h EI - another one time dose ordered for ED as pt still in the ED.  Pharmacy will continue to follow.   Crist Fat L 06/29/2015,2:19 PM

## 2015-06-29 NOTE — Progress Notes (Signed)
eLink Physician-Brief Progress Note Patient Name: Casey Murphy DOB: 1945/10/13 MRN: [id number]   Date of Service  06/29/2015  HPI/Events of Note  Hypotension and Lactic Acid = 5.7.  eICU Interventions  Will bolus with 0.9 NaCl 1 liter IV over 1 hour now.      Intervention Category Major Interventions: Acid-Base disturbance - evaluation and management;Hypotension - evaluation and management  Lenell Antu 06/29/2015, 6:51 PM

## 2015-06-29 NOTE — ED Notes (Signed)
Patient transported to CT 

## 2015-06-29 NOTE — ED Notes (Signed)
Bladder scan results 141

## 2015-06-29 NOTE — ED Notes (Signed)
Pt had a Foley cath placed last Saturday, by his urologist for BPH, now c/o fever N/V. Has had a foley in the past and same has occurred

## 2015-06-29 NOTE — Progress Notes (Addendum)
Pt BP MAP 50s, pt obs sleeping, BP rechecked, communicated with [facility] RN, nursing will cont to monitor, PT Latic Acid increased, [place]in[place] RN updated plans to communicated with MD, and call RN back

## 2015-06-29 NOTE — ED Provider Notes (Signed)
[facility] Emergency Department Provider Note  ____________________________________________  Time seen: Approximately 3:45 AM  I have reviewed the triage vital signs and the nursing notes.   HISTORY  Chief Complaint Fever and Code Sepsis  Patient is Confused, insistent with acute delirium, which is limiting the history  HPI Casey Murphy is a 70 y.o. 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  He went to [facility] about 4 days ago for urinary retention and he had an indwelling catheter placed and was discharged home.  He is not on any antibiotics.  He is not able to provide much history, but his wife reports that he has had a gradual onset of general malaise since the catheter was placed but that he became acutely worse this evening.  He has been vomiting, having fever and chills, and has been confused which happen to him in the past when he was septic.  She states that he got a "blood infection with MRSA" when he was hospitalized at [facility] in the past.  The patient denies any pain or discomfort at this time although he is clearly agitated and confused.  When asked specifically, he does admit to some bilateral flank and lower back pain.  He is vomiting in the emergency department, has a heart rate in the 120s, and is agitated.   Past Medical History  Diagnosis Date  . Hypertension   . BPH (benign prostatic hyperplasia)   . CHF (congestive heart failure) [facility])     Patient Active Problem List   Diagnosis Date Noted  . Sepsis (HCC) 06/29/2015  . UTI (lower urinary tract infection) 06/29/2015  . Hypotension 06/29/2015    Past Surgical History  Procedure Laterality Date  . None      Current Outpatient Rx  Name  Route  Sig  Dispense  Refill  . aspirin EC 81 MG tablet   Oral   Take 81 mg by mouth daily.          Marland Kitchen losartan (COZAAR) 100 MG tablet   Oral   Take 100 mg by mouth daily.         . metoprolol succinate (TOPROL-XL) 50 MG 24 hr tablet   Oral   Take 50 mg by mouth daily. Take with or immediately following a meal.         . Multiple Vitamin (MULTIVITAMIN WITH MINERALS) TABS tablet   Oral   Take 1 tablet by mouth daily.         . tamsulosin (FLOMAX) 0.4 MG CAPS capsule   Oral   Take 0.4 mg by mouth daily.           Allergies Lisinopril and Sulfa antibiotics  History reviewed. No pertinent family history.  Social History Social History  Substance Use Topics  . Smoking status: Former Smoker    Types: Cigarettes    Quit date: 05/29/2015  . Smokeless tobacco: Never Used  . Alcohol Use: 0.0 oz/week    0 Standard drinks or equivalent per week     Comment: wine occasionally    Review of Systems ROS limited by acute delirium / critical illness  ____________________________________________   PHYSICAL EXAM:  VITAL SIGNS: ED Triage Vitals  Enc Vitals Group     BP 06/29/15 0233 115/67 mmHg     Pulse Rate 06/29/15 0242 110     Resp 06/29/15  0243 20     Temp 06/29/15 0243 101.1 F (38.4 C)     Temp Source 06/29/15 0243 Oral     SpO2 06/29/15 0239 95 %     Weight 06/29/15 0243 210 lb (95.255 kg)     Height 06/29/15 0243 6\' 3"  (1.905 m)     Head Cir --      Peak Flow --      Pain Score --      Pain Loc --      Pain Edu? --      Excl. in GC? --     Constitutional: Alert but confused, delirious.  Appears ill but non-toxic. Eyes: Conjunctivae are normal. PERRL. EOMI. Head: Atraumatic. Nose: No congestion/rhinnorhea. Mouth/Throat: Mucous membranes are dry.  Oropharynx non-erythematous. Neck: No stridor.  No meningismus, easily moving head and neck all around with no pain or discomfort Cardiovascular: Tachycardia, regular rhythm. Grossly normal heart sounds.  Good peripheral circulation. Respiratory: Normal respiratory effort.  No retractions. Lungs  CTAB. Gastrointestinal: Soft and nontender. No distention. No abdominal bruits. No CVA tenderness. Genitourinary: Indwelling Foley catheter with red cloudy urine in the bag Musculoskeletal: No lower extremity tenderness nor edema.  No joint effusions. Neurologic:  Normal speech and language. No gross focal neurologic deficits are appreciated.  Skin:  Skin is warm, dry and intact. No rash noted. Psychiatric: Speaking to me clearly but cannot respond to simple questions.  Knows his name and knows his wife, otherwise not oriented.  ____________________________________________   LABS (all labs ordered are listed, but only abnormal results are displayed)  Labs Reviewed  COMPREHENSIVE METABOLIC PANEL - Abnormal; Notable for the following:    Glucose, Bld 103 (*)    BUN 31 (*)    Creatinine, Ser 1.73 (*)    GFR calc non Af Amer 38 (*)    GFR calc Af Amer 44 (*)    All other components within normal limits  CBC WITH DIFFERENTIAL/PLATELET - Abnormal; Notable for the following:    Lymphs Abs 0.7 (*)    Monocytes Absolute 0.0 (*)    All other components within normal limits  LACTIC ACID, PLASMA - Abnormal; Notable for the following:    Lactic Acid, Venous 3.9 (*)    All other components within normal limits  LACTIC ACID, PLASMA - Abnormal; Notable for the following:    Lactic Acid, Venous 4.2 (*)    All other components within normal limits  URINALYSIS COMPLETEWITH MICROSCOPIC ([facility] ONLY) - Abnormal; Notable for the following:    Color, Urine AMBER (*)    APPearance CLOUDY (*)    Ketones, ur TRACE (*)    Hgb urine dipstick 3+ (*)    Protein, ur >500 (*)    Leukocytes, UA 3+ (*)    Bacteria, UA FEW (*)    Squamous Epithelial / LPF 0-5 (*)    All other components within normal limits  TROPONIN I - Abnormal; Notable for the following:    Troponin I 0.04 (*)    All other components within normal limits  APTT - Abnormal; Notable for the following:    aPTT <24 (*)    All other  components within normal limits  CULTURE, BLOOD (ROUTINE X 2)  CULTURE, BLOOD (ROUTINE X 2)  URINE CULTURE  LIPASE, BLOOD  PROTIME-INR   ____________________________________________  EKG  ED ECG REPORT I, Rc Amison, the attending physician, personally viewed and interpreted this ECG.  Date: 06/29/2015 EKG Time: 3:54 Rate: 124 Rhythm: normal sinus rhythm  QRS Axis: normal Intervals: normal ST/T Wave abnormalities: normal Conduction Disutrbances: none Narrative Interpretation: unremarkable  ____________________________________________  RADIOLOGY   Dg Chest Port 1 View  06/29/2015  CLINICAL DATA:  Acute onset of fever, nausea and vomiting. Recent Foley catheter placement. Initial encounter. EXAM: PORTABLE CHEST 1 VIEW COMPARISON:  None. FINDINGS: The lungs are well-aerated. Mild bibasilar atelectasis is noted. There is no evidence of pleural effusion or pneumothorax. The cardiomediastinal silhouette is within normal limits. No acute osseous abnormalities are seen. IMPRESSION: Mild bibasilar atelectasis noted.  Lungs otherwise clear. Electronically Signed   By: Roanna Raider M.D.   On: 06/29/2015 03:16   Ct Renal Stone Study  06/29/2015  CLINICAL DATA:  70 year old male with urinary retention and sepsis and hematuria. EXAM: CT ABDOMEN AND PELVIS WITHOUT CONTRAST TECHNIQUE: Multidetector CT imaging of the abdomen and pelvis was performed following the standard protocol without IV contrast. COMPARISON:  None. FINDINGS: Evaluation of this exam is limited in the absence of intravenous contrast. Minimal bibasilar dependent atelectatic changes. The visualized lung bases are otherwise clear. No intra-abdominal free air or free fluid. Multiple hepatic hypodense lesions measuring up to 1.8 cm in the left lobe of the liver. This lesions are incompletely characterized but may represent or hemangioma. Nonemergent MRI or dedicated multiphasic contrast enhanced CT is recommended for further  characterization. The gallbladder, pancreas, spleen, and the right adrenal gland appear unremarkable. There is a 12 mm indeterminate left adrenal nodule, likely an adenoma. There is no hydronephrosis on either side. Punctate nonobstructing right renal calculus may be present. Subcentimeter ill-defined left renal parenchymal hypodense lesion is not characterized on this noncontrast study but may represent cysts. Mild nonspecific bilateral perinephric stranding noted. Correlation with urinalysis recommended to exclude UTI. The visualized ureters appear unremarkable. The urinary bladder is decompressed around a Foley catheter. The prostate gland is enlarged measuring 8.6 cm in transverse diameter. There are scattered sigmoid diverticula without active inflammatory changes. Moderate stool throughout the colon. No evidence of bowel obstruction or inflammation. Normal appendix. There is aortoiliac atherosclerotic disease. There is a 2.6 cm infrarenal aortic ectasia. No portal venous gas identified. There is no adenopathy. The abdominal wall soft tissues appear unremarkable. There is degenerative changes of the spine. No acute fracture. IMPRESSION: No hydronephrosis or obstructing stone. Correlation with urinalysis recommended to exclude UTI. Enlarged prostate gland. No evidence of bowel obstruction or inflammation.  Normal appendix. Sigmoid diverticulosis. Small scattered hepatic hypodense lesions. Electronically Signed   By: Elgie Collard M.D.   On: 06/29/2015 04:20    ____________________________________________   PROCEDURES  Procedure(s) performed: None  Critical Care performed: Yes, see critical care note(s)  CRITICAL CARE Performed by: Loleta Rose   Total critical care time: 45 minutes  Critical care time was exclusive of separately billable procedures and treating other patients.  Critical care was necessary to treat or prevent imminent or life-threatening deterioration.  Critical care was  time spent personally by me on the following activities: development of treatment plan with patient and/or surrogate as well as nursing, discussions with consultants, evaluation of patient's response to treatment, examination of patient, obtaining history from patient or surrogate, ordering and performing treatments and interventions, ordering and review of laboratory studies, ordering and review of radiographic studies, pulse oximetry and re-evaluation of patient's condition.  ____________________________________________   INITIAL IMPRESSION / ASSESSMENT AND PLAN / ED COURSE  Pertinent labs & imaging results that were available during my care of the patient were reviewed by me and considered in my medical  decision making (see chart for details).  The patient's nurse correctly identified him as sepsis upon arrival and initiated sepsis protocol and informed me; I added the additional sepsis protocol orders and empiric antibiotics and fluids.  When his lactate returned at 3.9, I added an order for 30 mL/kg of normal saline rather than just the initial liter fluid bolus which was still running.  After learning of the patient's history of staph infection I added an order for vancomycin 1.5 g IV in addition to the ceftriaxone order that was originally placed.  I am also obtaining a noncontrast CT scan of the abdomen and pelvis; given his retention and his hematuria, we need to know if he has a kidney stone as this may be a nidus of infection.  This may also reveal other or not he has pyelonephritis.  I will not give contrast given his reduced GFR.  I discovered that apparently my initial order for ceftriaxone 2 g IV was changed by someone to 1 g IV, so I have ordered a second gram for full sepsis recommended treatment.    ----------------------------------------- 4:40 AM on 06/29/2015 -----------------------------------------  Blood pressure remains stable.  No leukocytosis, but febrile, tachycardic,  delirious, and with a grossly infected urine.  CT scan of the abdomen and pelvis is unremarkable.  We will admit for further management.  Mental status improving.  Repeat lactic acid in about an hour.  ____________________________________________  FINAL CLINICAL IMPRESSION(S) / ED DIAGNOSES  Final diagnoses:  Urinary retention  Hematuria  Severe sepsis (HCC)  Acute kidney injury (HCC)  UTI (lower urinary tract infection)      NEW MEDICATIONS STARTED DURING THIS VISIT:  New Prescriptions   No medications on file     Loleta Rose, MD 06/29/15 0740

## 2015-06-29 NOTE — Progress Notes (Signed)
eLink Physician-Brief Progress Note Patient Name: Casey Murphy DOB: Jun 02, 1946 MRN: [id number]   Date of Service  06/29/2015  HPI/Events of Note  Persistent lactic acidosis (last lactic acid = 5.2). Patient now hypotensive 90/67. Refuses Central venous line. Now c/o SOB.  eICU Interventions  Will order: 1. Continue to trend lactic acid. 2. Phenylephrine IV infusion. Titrate to MAP >= 65.  3. 2D Cardiac Echo in AM.  4. Portable CXR now.      Intervention Category Major Interventions: Hypotension - evaluation and management;Acid-Base disturbance - evaluation and management  Wilmetta Speiser Eugene 06/29/2015, 11:09 PM

## 2015-06-30 ENCOUNTER — Inpatient Hospital Stay
Admit: 2015-06-30 | Discharge: 2015-06-30 | Disposition: A | Discharge status: Home / Self Care | Payer: Medicare HMO | Attending: Pulmonary Disease | Admitting: Pulmonary Disease

## 2015-06-30 LAB — CBC
HEMATOCRIT: 47.3 % (ref 40.0–52.0)
HEMOGLOBIN: 14.9 g/dL (ref 13.0–18.0)
MCH: 28 pg (ref 26.0–34.0)
MCHC: 31.6 g/dL — ABNORMAL LOW (ref 32.0–36.0)
MCV: 88.8 fL (ref 80.0–100.0)
Platelets: 79 10*3/uL — ABNORMAL LOW (ref 150–440)
RBC: 5.33 MIL/uL (ref 4.40–5.90)
RDW: 15.3 % — AB (ref 11.5–14.5)
WBC: 22.2 10*3/uL — ABNORMAL HIGH (ref 3.8–10.6)

## 2015-06-30 LAB — LACTIC ACID, PLASMA
LACTIC ACID, VENOUS: 4 mmol/L — AB (ref 0.5–2.0)
Lactic Acid, Venous: 5.5 mmol/L (ref 0.5–2.0)

## 2015-06-30 LAB — BASIC METABOLIC PANEL
ANION GAP: 13 (ref 5–15)
BUN: 44 mg/dL — ABNORMAL HIGH (ref 6–20)
CALCIUM: 7.8 mg/dL — AB (ref 8.9–10.3)
CHLORIDE: 110 mmol/L (ref 101–111)
CO2: 19 mmol/L — AB (ref 22–32)
Creatinine, Ser: 2.95 mg/dL — ABNORMAL HIGH (ref 0.61–1.24)
GFR calc Af Amer: 23 mL/min — ABNORMAL LOW (ref >60)
GFR calc non Af Amer: 20 mL/min — ABNORMAL LOW (ref >60)
GLUCOSE: 84 mg/dL (ref 65–99)
Potassium: 5.2 mmol/L — ABNORMAL HIGH (ref 3.5–5.1)
Sodium: 142 mmol/L (ref 135–145)

## 2015-06-30 LAB — C DIFFICILE QUICK SCREEN W PCR REFLEX
C DIFFICILE (CDIFF) INTERP: NEGATIVE
C DIFFICILE (CDIFF) TOXIN: NEGATIVE
C DIFFICLE (CDIFF) ANTIGEN: NEGATIVE

## 2015-06-30 MED ORDER — ENSURE ENLIVE PO LIQD
237.0000 mL | Freq: Three times a day (TID) | ORAL | Status: DC
  Administered 2015-07-01 – 2015-07-03 (×7): 237 mL via ORAL

## 2015-06-30 MED ORDER — LOPERAMIDE HCL 2 MG PO CAPS
2.0000 mg | ORAL_CAPSULE | ORAL | Status: DC | PRN
  Administered 2015-06-30 (×3): 2 mg via ORAL
  Filled 2015-06-30 (×3): qty 1

## 2015-06-30 NOTE — Progress Notes (Signed)
[facility] - [place] at [facility]   PATIENT NAME: Casey Murphy    MR#:  [id number]  DATE OF BIRTH:  December 02, 1945  SUBJECTIVE:   Patient here due to fever, hypotension and noted to be septic secondary to UTI. BP has improved. Having some diarrhea this a.m.  Wife at bedside and has lots of questions to ask.   REVIEW OF SYSTEMS:    Review of Systems  Constitutional: Negative for fever and chills.  HENT: Negative for congestion and tinnitus.   Eyes: Negative for blurred vision and double vision.  Respiratory: Negative for cough, shortness of breath and wheezing.   Cardiovascular: Negative for chest pain, orthopnea and PND.  Gastrointestinal: Positive for diarrhea. Negative for nausea, vomiting and abdominal pain.  Genitourinary: Negative for dysuria and hematuria.  Neurological: Positive for weakness (generalized). Negative for dizziness, sensory change and focal weakness.  All other systems reviewed and are negative.   Nutrition: Heart healthy Tolerating Diet: Yes Tolerating PT: Await evaluation   DRUG ALLERGIES:   Allergies  Allergen Reactions  . Lisinopril Other (See Comments)    Reaction: unknown  . Sulfa Antibiotics Hives    VITALS:  Blood pressure 103/74, pulse 95, temperature 98.3 F (36.8 C), temperature source Oral, resp. rate 34, height  (1.905 m), weight 95.255 kg (210 lb), SpO2 95 %.  PHYSICAL EXAMINATION:   Physical Exam  GENERAL:  70 y.o.-year-old patient lying in the bed with no acute distress.  EYES: Pupils equal, round, reactive to light and accommodation. No scleral icterus. Extraocular muscles intact.  HEENT: Head atraumatic, normocephalic. Oropharynx and nasopharynx clear.  NECK:  Supple, no jugular venous distention. No thyroid enlargement, no tenderness.  LUNGS: Normal breath sounds bilaterally, no wheezing, rales, rhonchi. No use of accessory muscles of respiration.  CARDIOVASCULAR: S1, S2 normal. No murmurs, rubs,  or gallops.  ABDOMEN: Soft, nontender, nondistended. Bowel sounds present. No organomegaly or mass.  EXTREMITIES: No cyanosis, clubbing or edema b/l.    NEUROLOGIC: Cranial nerves II through XII are intact. No focal Motor or sensory deficits b/l.  Globally weak.   PSYCHIATRIC: The patient is alert and oriented x 3.  Good affect.  SKIN: No obvious rash, lesion, or ulcer.   Foley catheter in place with tea colored Urine draining.  LABORATORY PANEL:   CBC  Recent Labs Lab 06/30/15 0401  WBC 22.2*  HGB 14.9  HCT 47.3  PLT 79*   ------------------------------------------------------------------------------------------------------------------  Chemistries   Recent Labs Lab 06/29/15 0301 06/30/15 0401  NA 138 142  K 4.5 5.2*  CL 104 110  CO2 24 19*  GLUCOSE 103* 84  BUN 31* 44*  CREATININE 1.73* 2.95*  CALCIUM 9.2 7.8*  AST 24  --   ALT 21  --   ALKPHOS 70  --   BILITOT 1.0  --    ------------------------------------------------------------------------------------------------------------------  Cardiac Enzymes  Recent Labs Lab 06/29/15 0301  TROPONINI 0.04*   ------------------------------------------------------------------------------------------------------------------  RADIOLOGY:  Dg Chest Port 1 View  06/29/2015  CLINICAL DATA:  Acute onset of shortness of breath. Initial encounter. EXAM: PORTABLE CHEST 1 VIEW COMPARISON:  Chest radiograph performed earlier today at 3:02 a.m. FINDINGS: The lungs are relatively well expanded. Vascular congestion is noted, with mild bibasilar atelectasis. No pleural effusion or pneumothorax is seen The cardiomediastinal silhouette is normal in size. No acute osseous abnormalities identified. IMPRESSION: Vascular congestion, with mild bibasilar atelectasis. Electronically Signed   By: Roanna Raider M.D.   On: 06/29/2015 23:38   Dg  Chest Port 1 View  06/29/2015  CLINICAL DATA:  Acute onset of fever, nausea and vomiting. Recent  Foley catheter placement. Initial encounter. EXAM: PORTABLE CHEST 1 VIEW COMPARISON:  None. FINDINGS: The lungs are well-aerated. Mild bibasilar atelectasis is noted. There is no evidence of pleural effusion or pneumothorax. The cardiomediastinal silhouette is within normal limits. No acute osseous abnormalities are seen. IMPRESSION: Mild bibasilar atelectasis noted.  Lungs otherwise clear. Electronically Signed   By: Roanna Raider M.D.   On: 06/29/2015 03:16   Ct Renal Stone Study  06/29/2015  CLINICAL DATA:  70 year old male with urinary retention and sepsis and hematuria. EXAM: CT ABDOMEN AND PELVIS WITHOUT CONTRAST TECHNIQUE: Multidetector CT imaging of the abdomen and pelvis was performed following the standard protocol without IV contrast. COMPARISON:  None. FINDINGS: Evaluation of this exam is limited in the absence of intravenous contrast. Minimal bibasilar dependent atelectatic changes. The visualized lung bases are otherwise clear. No intra-abdominal free air or free fluid. Multiple hepatic hypodense lesions measuring up to 1.8 cm in the left lobe of the liver. This lesions are incompletely characterized but may represent or hemangioma. Nonemergent MRI or dedicated multiphasic contrast enhanced CT is recommended for further characterization. The gallbladder, pancreas, spleen, and the right adrenal gland appear unremarkable. There is a 12 mm indeterminate left adrenal nodule, likely an adenoma. There is no hydronephrosis on either side. Punctate nonobstructing right renal calculus may be present. Subcentimeter ill-defined left renal parenchymal hypodense lesion is not characterized on this noncontrast study but may represent cysts. Mild nonspecific bilateral perinephric stranding noted. Correlation with urinalysis recommended to exclude UTI. The visualized ureters appear unremarkable. The urinary bladder is decompressed around a Foley catheter. The prostate gland is enlarged measuring 8.6 cm in  transverse diameter. There are scattered sigmoid diverticula without active inflammatory changes. Moderate stool throughout the colon. No evidence of bowel obstruction or inflammation. Normal appendix. There is aortoiliac atherosclerotic disease. There is a 2.6 cm infrarenal aortic ectasia. No portal venous gas identified. There is no adenopathy. The abdominal wall soft tissues appear unremarkable. There is degenerative changes of the spine. No acute fracture. IMPRESSION: No hydronephrosis or obstructing stone. Correlation with urinalysis recommended to exclude UTI. Enlarged prostate gland. No evidence of bowel obstruction or inflammation.  Normal appendix. Sigmoid diverticulosis. Small scattered hepatic hypodense lesions. Electronically Signed   By: Elgie Collard M.D.   On: 06/29/2015 04:20     ASSESSMENT AND PLAN:   70 yo male w/ hx of HTN, hx of systolic CHF, BPH w/ urinary retention who presented to the hospital w/ fever and noted to have sepsis secondary to UTI.  #1 sepsis-patient presented with fever, tachycardia, hypotension, positive urinalysis and elevated lactic acid. -Continue aggressive IV fluids, IV meropenem, patient's blood and urine cultures are positive for gram-negative rod and ID pending.   #2 UTI-this is the cause of patient's sepsis. -Continue IV meropenem - follow identification of urine/blood cultures.   #3 hypotension-secondary to the sepsis and UTI. - much improved w/ IV fluids and will monitor.   #4 history of BPH with urinary retention-patient presented to the hospital with a chronic indwelling Foley. -Continue Flomax. - appreciate Urology input and they recommend cont. Foley and current care and will need to go home with foley and follow up with them as outpatient.    #5 history of CHF-the patient is not in congestive heart failure presently. -Hold beta blockers given the hypotension, hold diuretics.  #6 elevated troponin-likely secondary to demand ischemia  from  hypotension and sepsis. -Follow serial cardiac markers.  #7 Acute Renal Failure - likely ATN from sepsis and hypotension.  - cont. IV fluids and follow BuN/Cr.  If not improving consider Nephrology consult.   #8 Diarrhea - C. Diff (-). PRN Imodium.  - possibly related to abx.     All the records are reviewed and case discussed with Care Management/Social Workerr. Management plans discussed with the patient, family and they are in agreement.  CODE STATUS: Full  DVT Prophylaxis: Lovenox  TOTAL TIME TAKING CARE OF THIS PATIENT: 30 minutes.   POSSIBLE D/C IN 2-3 DAYS, DEPENDING ON CLINICAL CONDITION.   Houston Siren M.D on 06/30/2015 at 3:21 PM  Between 7am to 6pm - Pager - [phone]  After 6pm go to [email or web address] - password [email or web address] [facility]  [place] [facility]  Office  [phone]  CC: Primary care physician; No primary care provider on file.

## 2015-06-30 NOTE — Progress Notes (Signed)
Pharmacy Antibiotic Follow-up Note  Casey Murphy is a 70 y.o. year-old male admitted on 06/29/2015.  The patient is currently on day 2 of meropenem for UTI/sepsis.  Assessment/Plan: Will continue meropenem 2 g IV q12h (renally adjusted from meropenem 2 g IV q8h per [employer] protocol). Will f/u renal function and culture results.    Temp (24hrs), Avg:98.7 F (37.1 C), Min:98.1 F (36.7 C), Max:99.2 F (37.3 C)   Recent Labs Lab 06/29/15 0301 06/30/15 0401  WBC 4.7 22.2*     Recent Labs Lab 06/29/15 0301 06/30/15 0401  CREATININE 1.73* 2.95*   Estimated Creatinine Clearance: 27.5 mL/min (by C-G formula based on Cr of 2.95).    Allergies  Allergen Reactions  . Lisinopril Other (See Comments)    Reaction: unknown  . Sulfa Antibiotics Hives    Antimicrobials this admission: Vancomycin/Zosyn 1/19>>1/19 Meropenem 1/19>>    Microbiology results: 1/19 BCx: GNR/Ecoli 1/19 UCx: pending  Thank you for allowing pharmacy to be a part of this patient's care.  Luisa Hart D PharmD 06/30/2015 9:50 AM

## 2015-06-30 NOTE — Progress Notes (Signed)
Dr. Anne Hahn is notified about patient's multiple watery stools, four in the past 5 hours. Order received and followed. Continue to monitor the patient.

## 2015-06-30 NOTE — Clinical Documentation Improvement (Signed)
Internal Medicine  A cause and effect relationship may not be assumed and must be documented by a provider.  Please clarify the relationship, if any, between "Foley Catheter" and "UTI" which is now causing "Sepsis"  Are the conditions:   UTI due to Foley Catheter   UTI unrelated to Foley Catheter  Other  Clinically Undetermined   Supporting Information (risk factors, sign and symptoms, diagnostics, treatment):  H + P ...Marland KitchenMarland KitchenMarland Kitchenindwelling Foley catheter which was placed 4 days ago for urinary obstruction secondary to prostate hypertrophy"   Please exercise your independent, professional judgment when responding. A specific answer is not anticipated or expected.   Thank You,  Lavonda Jumbo Health Information Management [place] [phone]

## 2015-06-30 NOTE — Progress Notes (Signed)
*  PRELIMINARY RESULTS* Echocardiogram 2D Echocardiogram has been performed.  Casey Murphy Hege 06/30/2015, 11:23 AM

## 2015-06-30 NOTE — Progress Notes (Signed)
Dr. Arsenio Loader is notified about patient's lactic acid 5.2. Patient is slightly SOB, NC2L applied. New orders received continue to monitor the patient.

## 2015-06-30 NOTE — Progress Notes (Signed)
Called general surgery to give report. Pt to be transferred to room 204. RN will take report in ten minutes.  Called wife to notify of transfer.

## 2015-06-30 NOTE — Progress Notes (Signed)
Report called to [place] on 2C. NT transporting pt to new room.

## 2015-06-30 NOTE — Consult Note (Addendum)
 @ 8:23 AM   Casey Murphy [birth year] [id number]  Referring provider: Dr. Joaquim Lai  Chief Complaint  Patient presents with  . Fever  . Code Sepsis    HPI: The patient is a 70 year old gentleman with a past medical BPH with recent Foley placement presents with sepsis likely from urinary tract infection. Urology was consulted because the patient has a Foley catheter. Catheter was placed proximally week ago at [facility]. The patient is on Flomax. He sees a Insurance underwriter at [facility]. On CT scan which shows no GU pathology he does however have a large prostate measuring approximately 8 cm in width. It does protrude in the bladder. He was supposed to see his urologist today however he is admitted in the hospital with sepsis.   PMH: Past Medical History  Diagnosis Date  . Hypertension   . BPH (benign prostatic hyperplasia)   . CHF (congestive heart failure) [facility])     Surgical History: Past Surgical History  Procedure Laterality Date  . None      Home Medications:    Medication List    ASK your doctor about these medications        aspirin EC 81 MG tablet  Take 81 mg by mouth daily.     losartan 100 MG tablet  Commonly known as:  COZAAR  Take 100 mg by mouth daily.     metoprolol succinate 50 MG 24 hr tablet  Commonly known as:  TOPROL-XL  Take 50 mg by mouth daily. Take with or immediately following a meal.     multivitamin with minerals Tabs tablet  Take 1 tablet by mouth daily.     tamsulosin 0.4 MG Caps capsule  Commonly known as:  FLOMAX  Take 0.4 mg by mouth daily.        Allergies:  Allergies  Allergen Reactions  . Lisinopril Other (See Comments)    Reaction: unknown  . Sulfa Antibiotics Hives    Family History: History reviewed. No pertinent family history.  Social History:  reports that he quit smoking about 4 weeks ago. His smoking use included Cigarettes. He has never used smokeless tobacco. He reports that he drinks alcohol.  He reports that he does not use illicit drugs.  ROS: 12 point ROS otherwise negative                                        Physical Exam: BP 97/76 mmHg  Pulse 108  Temp(Src) 98.6 F (37 C) (Oral)  Resp 32  Ht  (1.905 m)  Wt 210 lb (95.255 kg)  BMI 26.25 kg/m2  SpO2 93%  Constitutional:  Alert and oriented, No acute distress. HEENT: [employer] AT, moist mucus membranes.  Trachea midline, no masses. Cardiovascular: No clubbing, cyanosis, or edema. Respiratory: Normal respiratory effort, no increased work of breathing. GI: Abdomen is soft, nontender, nondistended, no abdominal masses GU: No CVA tenderness. Foley in place. Clear yellow urine. Testicles descended equal bilaterally no masses. Skin: No rashes, bruises or suspicious lesions. Lymph: No cervical or inguinal adenopathy. Neurologic: Grossly intact, no focal deficits, moving all 4 extremities. Psychiatric: Normal mood and affect.  Laboratory Data: Lab Results  Component Value Date   WBC 22.2* 06/30/2015   HGB 14.9 06/30/2015   HCT 47.3 06/30/2015   MCV 88.8 06/30/2015   PLT 79* 06/30/2015    Lab Results  Component Value Date  CREATININE 2.95* 06/30/2015    No results found for: PSA  No results found for: TESTOSTERONE  No results found for: HGBA1C  Urinalysis    Component Value Date/Time   COLORURINE AMBER* 06/29/2015 0329   APPEARANCEUR CLOUDY* 06/29/2015 0329   LABSPEC 1.024 06/29/2015 0329   PHURINE 5.0 06/29/2015 0329   GLUCOSEU NEGATIVE 06/29/2015 0329   HGBUR 3+* 06/29/2015 0329   BILIRUBINUR NEGATIVE 06/29/2015 0329   KETONESUR TRACE* 06/29/2015 0329   PROTEINUR >500* 06/29/2015 0329   NITRITE NEGATIVE 06/29/2015 0329   LEUKOCYTESUR 3+* 06/29/2015 0329    Pertinent Imaging: Large prostate seen on CT scan otherwise no GU pathology  Assessment & Plan:    1. Urinary tract infection with sepsis -Continue antibiotic pending culture and sensitivity results  2. BPH with  urinary retention -Continue Foley catheter  -Continue Flomax -Patient should be discharged home with Foley catheter. He'll follow-up as an outpatient with either my office or his urologist at [facility] for trial of void  Hildred Laser, MD  [facility] [address], [address] [place], [place] [address] [phone]

## 2015-07-01 LAB — CULTURE, BLOOD (ROUTINE X 2)

## 2015-07-01 LAB — URINE CULTURE: Culture: >=100000

## 2015-07-01 LAB — CBC
HEMATOCRIT: 42.8 % (ref 40.0–52.0)
HEMOGLOBIN: 13.8 g/dL (ref 13.0–18.0)
MCH: 28.3 pg (ref 26.0–34.0)
MCHC: 32.3 g/dL (ref 32.0–36.0)
MCV: 87.6 fL (ref 80.0–100.0)
Platelets: 75 10*3/uL — ABNORMAL LOW (ref 150–440)
RBC: 4.89 MIL/uL (ref 4.40–5.90)
RDW: 15.1 % — ABNORMAL HIGH (ref 11.5–14.5)
WBC: 26.4 10*3/uL — ABNORMAL HIGH (ref 3.8–10.6)

## 2015-07-01 LAB — BASIC METABOLIC PANEL
Anion gap: 10 (ref 5–15)
BUN: 41 mg/dL — ABNORMAL HIGH (ref 6–20)
CHLORIDE: 115 mmol/L — AB (ref 101–111)
CO2: 17 mmol/L — AB (ref 22–32)
Calcium: 7.6 mg/dL — ABNORMAL LOW (ref 8.9–10.3)
Creatinine, Ser: 1.71 mg/dL — ABNORMAL HIGH (ref 0.61–1.24)
GFR calc non Af Amer: 38 mL/min — ABNORMAL LOW (ref >60)
GFR, EST AFRICAN AMERICAN: 45 mL/min — AB (ref >60)
Glucose, Bld: 78 mg/dL (ref 65–99)
POTASSIUM: 3.9 mmol/L (ref 3.5–5.1)
SODIUM: 142 mmol/L (ref 135–145)

## 2015-07-01 MED ORDER — CEPHALEXIN 500 MG PO CAPS
500.0000 mg | ORAL_CAPSULE | Freq: Three times a day (TID) | ORAL | Status: DC
  Administered 2015-07-01 – 2015-07-03 (×7): 500 mg via ORAL
  Filled 2015-07-01 (×7): qty 1

## 2015-07-01 NOTE — Progress Notes (Signed)
[facility] Phys[facility] - [place] at [facility]   PATIENT NAME: Casey Murphy    MR#:  [id number]  DATE OF BIRTH:  12/10/1945  SUBJECTIVE:   Patient here due to fever, hypotension and noted to be septic secondary to UTI. BP has remained low. Not on IV fluids due to cardiomyopathy with ejection fraction of 25-30%. Blood cultures are positive for Escherichia coli. Urine culture positive for Escherichia coli. Escherichia coli is pansensitive. Creatinine has been improving from 2.95-1.71 today. Patient denies any pain.   REVIEW OF SYSTEMS:    Review of Systems  Constitutional: Negative for fever and chills.  HENT: Negative for congestion and tinnitus.   Eyes: Negative for blurred vision and double vision.  Respiratory: Negative for cough, shortness of breath and wheezing.   Cardiovascular: Negative for chest pain, orthopnea and PND.  Gastrointestinal: Positive for diarrhea. Negative for nausea, vomiting and abdominal pain.  Genitourinary: Negative for dysuria and hematuria.  Neurological: Positive for weakness (generalized). Negative for dizziness, sensory change and focal weakness.  All other systems reviewed and are negative.   Nutrition: Heart healthy Tolerating Diet: Yes Tolerating PT: Await evaluation   DRUG ALLERGIES:   Allergies  Allergen Reactions  . Lisinopril Other (See Comments)    Reaction: unknown  . Sulfa Antibiotics Hives    VITALS:  Blood pressure 103/61, pulse 108, temperature 98.4 F (36.9 C), temperature source Oral, resp. rate 18, height  (1.905 m), weight 102.921 kg (226 lb 14.4 oz), SpO2 100 %.  PHYSICAL EXAMINATION:   Physical Exam  GENERAL:  70 y.o.-year-old patient lying in the bed with no acute distress. Globally weak.  Some slurring of speech.  EYES: Pupils equal, round, reactive to light and accommodation. No scleral icterus. Extraocular muscles intact.  HEENT: Head atraumatic, normocephalic. Oropharynx and nasopharynx clear.   NECK:  Supple, no jugular venous distention. No thyroid enlargement, no tenderness.  LUNGS: Normal breath sounds bilaterally, no wheezing, rales, rhonchi. No use of accessory muscles of respiration.  CARDIOVASCULAR: S1, S2 normal. No murmurs, rubs, or gallops.  ABDOMEN: Soft, nontender, nondistended. Bowel sounds present. No organomegaly or mass.  EXTREMITIES: No cyanosis, clubbing or edema b/l.    NEUROLOGIC: Cranial nerves II through XII are intact. No focal Motor or sensory deficits b/l.  Globally weak.   PSYCHIATRIC: The patient is alert and oriented x 3.  Good affect.  SKIN: No obvious rash, lesion, or ulcer.   Foley catheter in place with tea colored Urine draining.  LABORATORY PANEL:   CBC  Recent Labs Lab 07/01/15 0548  WBC 26.4*  HGB 13.8  HCT 42.8  PLT 75*   ------------------------------------------------------------------------------------------------------------------  Chemistries   Recent Labs Lab 06/29/15 0301  07/01/15 0548  NA 138  < > 142  K 4.5  < > 3.9  CL 104  < > 115*  CO2 24  < > 17*  GLUCOSE 103*  < > 78  BUN 31*  < > 41*  CREATININE 1.73*  < > 1.71*  CALCIUM 9.2  < > 7.6*  AST 24  --   --   ALT 21  --   --   ALKPHOS 70  --   --   BILITOT 1.0  --   --   < > = values in this interval not displayed. ------------------------------------------------------------------------------------------------------------------  Cardiac Enzymes  Recent Labs Lab 06/29/15 0301  TROPONINI 0.04*   ------------------------------------------------------------------------------------------------------------------  RADIOLOGY:  Dg Chest Port 1 View  06/29/2015  CLINICAL DATA:  Acute onset  of shortness of breath. Initial encounter. EXAM: PORTABLE CHEST 1 VIEW COMPARISON:  Chest radiograph performed earlier today at 3:02 a.m. FINDINGS: The lungs are relatively well expanded. Vascular congestion is noted, with mild bibasilar atelectasis. No pleural effusion or  pneumothorax is seen The cardiomediastinal silhouette is normal in size. No acute osseous abnormalities identified. IMPRESSION: Vascular congestion, with mild bibasilar atelectasis. Electronically Signed   By: Roanna Raider M.D.   On: 06/29/2015 23:38     ASSESSMENT AND PLAN:   70 yo male w/ hx of HTN, hx of systolic CHF, BPH w/ urinary retention who presented to the hospital w/ fever and noted to have sepsis secondary to UTI.  #1 . Escherichia coli sepsis due to Escherichia coli UTI -patient presented with fever, tachycardia, hypotension, positive urinalysis and elevated lactic acid. -Continue antibiotic, change meropenem to Keflex by mouth,   #2 UTI due to Escherichia coli, likely Foley catheter related. -Continue oral Keflex instead of IV meropenem .   #3 hypotension-secondary to the sepsis and UTI. - much improved w/ IV fluids and will monitor. Continue low rate IV fluids at present due to cardiomyopathy  #4 history of BPH with urinary retention-patient presented to the hospital with a chronic indwelling Foley. -Continue Flomax. - appreciate Urology input and they recommend cont. Foley and current care and will need to go home with foley and follow up with them as outpatient.    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 -Holding  beta blockers given the hypotension, holding  Diuretics. Resume when blood pressure has improved  #6 elevated troponin-likely secondary to demand ischemia from hypotension and sepsis. -Follow serial cardiac markers.  #7 Acute Renal Failure - likely ATN from sepsis and hypotension.  - cont. low-grade IV fluids and follow BuN/Cr, which are improving.  If not improving consider Nephrology consult.   #8 Diarrhea - C. Diff (-). PRN Imodium.  - possibly related to abx.  Age meropenem to Keflex. Follow for diarrheal stool #9. Generalized weakness, get physical therapist involved. The patient may  benefit from skilled nursing facility rehabilitation placement   All the records are reviewed and case discussed with Care Management/Social Workerr. Management plans discussed with the patient, family and they are in agreement.  CODE STATUS: Full  DVT Prophylaxis: Lovenox  TOTAL TIME TAKING CARE OF THIS PATIENT: 35 minutes.   POSSIBLE D/C IN 2-3 DAYS, DEPENDING ON CLINICAL CONDITION.   Katharina Caper M.D on 07/01/2015 at 12:20 PM  Between 7am to 6pm - Pager - [phone]  After 6pm go to [email or web address] - password [email or web address] [facility]  [place] [facility]  Office  [phone]  CC: Primary care physician; No primary care provider on file.

## 2015-07-02 LAB — BASIC METABOLIC PANEL
Anion gap: 5 (ref 5–15)
BUN: 29 mg/dL — AB (ref 6–20)
CHLORIDE: 116 mmol/L — AB (ref 101–111)
CO2: 19 mmol/L — AB (ref 22–32)
CREATININE: 1.1 mg/dL (ref 0.61–1.24)
Calcium: 7.7 mg/dL — ABNORMAL LOW (ref 8.9–10.3)
GFR calc non Af Amer: >60 mL/min (ref >60)
Glucose, Bld: 92 mg/dL (ref 65–99)
POTASSIUM: 3.8 mmol/L (ref 3.5–5.1)
SODIUM: 140 mmol/L (ref 135–145)

## 2015-07-02 LAB — CBC
HEMATOCRIT: 41.2 % (ref 40.0–52.0)
Hemoglobin: 13.6 g/dL (ref 13.0–18.0)
MCH: 28.9 pg (ref 26.0–34.0)
MCHC: 33 g/dL (ref 32.0–36.0)
MCV: 87.7 fL (ref 80.0–100.0)
PLATELETS: 86 10*3/uL — AB (ref 150–440)
RBC: 4.7 MIL/uL (ref 4.40–5.90)
RDW: 15 % — ABNORMAL HIGH (ref 11.5–14.5)
WBC: 24 10*3/uL — AB (ref 3.8–10.6)

## 2015-07-02 MED ORDER — CHOLESTYRAMINE LIGHT 4 G PO PACK
4.0000 g | PACK | Freq: Every day | ORAL | Status: DC
  Administered 2015-07-02 – 2015-07-03 (×2): 4 g via ORAL
  Filled 2015-07-02 (×2): qty 1

## 2015-07-02 NOTE — Evaluation (Signed)
Physical Therapy Evaluation Patient Details Name: Casey Murphy MRN: [id number] DOB: July 07, 1945 Today's Date: 07/02/2015   History of Present Illness  69yo black male who comes to [facility] with fever, chills, and LBP, admitted for UTI. Pt was recently catheterized due to urinary obstruction 4d PTA. PMH: BPH, CHF. Recent echo showing 25-30% estimated EF. 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   Clinical Impression  Pt is semirecumbent in bed upon entry, A&Ox4, pleasant, and willing to participate in PT eval. Pt received on 2LO2, but satting well throughout session (94-100% c all activity), DC on room air. Pt's R eye appears moderately "blood-shot" with minimal puffiness, pt reporting the outside corner of the eye feels irritated, but does not recall any current problems with this eye. Pt demonstrates all mobility and gait with modified independence. Pt is very near self reported baseline, and does not feel as though PT p DC would be necessary. PT is agreeable, with no patent acute/subacute changes in functional status. Pt is safe for return to home once medically cleared. No equipment recommendations at this time.      Follow Up Recommendations No PT follow up    Equipment Recommendations  None recommended by PT    Recommendations for Other Services       Precautions / Restrictions Precautions Precautions: Fall Restrictions Weight Bearing Restrictions: No      Mobility  Bed Mobility Overal bed mobility: Modified Independent                Transfers Overall transfer level: Modified independent                  Ambulation/Gait Ambulation/Gait assistance: Supervision Ambulation Distance (Feet): 225 Feet Assistive device: None Gait Pattern/deviations: WFL(Within Functional Limits)     General Gait Details: slow, steady, and safe. Pt reports he feel minimally weak, but very close to baseline.   Stairs             Wheelchair Mobility    Modified Rankin (Stroke Patients Only)       Balance Overall balance assessment: No apparent balance deficits (not formally assessed);Modified Independent                                           Pertinent Vitals/Pain Pain Assessment: No/denies pain    Home Living Family/patient expects to be discharged to:: Private residence Living Arrangements: Spouse/significant other;Children Available Help at Discharge: Family Type of Home: House Home Access: Level entry     Home Layout: Multi-level;Able to live on main level with bedroom/bathroom Home Equipment: None      Prior Function Level of Independence: Independent               Hand Dominance        Extremity/Trunk Assessment   Upper Extremity Assessment: Overall WFL for tasks assessed           Lower Extremity Assessment: Overall WFL for tasks assessed;Generalized weakness (difficulty with standing from low surfaces, but able to perform with modI . )      Cervical / Trunk Assessment: Normal  Communication   Communication: No difficulties (hypophonia )  Cognition Arousal/Alertness: Awake/alert Behavior During Therapy: WFL for tasks assessed/performed Overall Cognitive Status: Within Functional Limits for tasks assessed  General Comments      Exercises        Assessment/Plan    PT Assessment Patent does not need any further PT services  PT Diagnosis Generalized weakness   PT Problem List    PT Treatment Interventions     PT Goals (Current goals can be found in the Care Plan section) Acute Rehab PT Goals Patient Stated Goal: return to home soon  PT Goal Formulation: All assessment and education complete, DC therapy    Frequency     Barriers to discharge        Co-evaluation               End of Session Equipment Utilized During Treatment: Gait belt Activity Tolerance: Patient tolerated treatment well;No  increased pain Patient left: in chair;with chair alarm set;with call bell/phone within reach Nurse Communication: Mobility status         Time: 1610-9604 PT Time Calculation (min) (ACUTE ONLY): 19 min   Charges:   PT Evaluation $PT Eval Low Complexity: 1 Procedure PT Treatments $Therapeutic Activity: 8-22 mins   PT G Codes:        2:10 PM, 07-18-15 Rosamaria Lints, PT, DPT PRN Physical Therapist at [facility] [place] License # [id number] [phone] (mobile)

## 2015-07-02 NOTE — Progress Notes (Signed)
[facility] - [place] at [facility]   PATIENT NAME: Casey Murphy    MR#:  [id number]  DATE OF BIRTH:  20-May-1946  SUBJECTIVE:   Patient here due to fever, hypotension and noted to be septic secondary to UTI. BP has remained low. Not on IV fluids due to cardiomyopathy with ejection fraction of 25-30%. Blood cultures are positive for Escherichia coli. Urine culture positive for Escherichia coli. Escherichia coli is pansensitive. Creatinine has been improving from 2.95-1.1 today. Patient denies any pain. Feels overall comfortable, but still weak. Did not ambulate with physical therapy yesterday, although order for  Physical therapy was ordered was made  REVIEW OF SYSTEMS:    Review of Systems  Constitutional: Negative for fever and chills.  HENT: Negative for congestion and tinnitus.   Eyes: Negative for blurred vision and double vision.  Respiratory: Negative for cough, shortness of breath and wheezing.   Cardiovascular: Negative for chest pain, orthopnea and PND.  Gastrointestinal: Positive for diarrhea. Negative for nausea, vomiting and abdominal pain.  Genitourinary: Negative for dysuria and hematuria.  Neurological: Positive for weakness (generalized). Negative for dizziness, sensory change and focal weakness.  All other systems reviewed and are negative.   Nutrition: Heart healthy Tolerating Diet: Yes Tolerating PT: Await evaluation   DRUG ALLERGIES:   Allergies  Allergen Reactions  . Lisinopril Other (See Comments)    Reaction: unknown  . Sulfa Antibiotics Hives    VITALS:  Blood pressure 117/76, pulse 99, temperature 98 F (36.7 C), temperature source Oral, resp. rate 20, height  (1.905 m), weight 102.921 kg (226 lb 14.4 oz), SpO2 99 %.  PHYSICAL EXAMINATION:   Physical Exam  GENERAL:  70 y.o.-year-old patient lying in the bed with no acute distress. Globally weak.  Speech is better, less slurring . Just woken up from sleep EYES:  Pupils equal, round, reactive to light and accommodation. No scleral icterus. Extraocular muscles intact.  HEENT: Head atraumatic, normocephalic. Oropharynx and nasopharynx clear.  NECK:  Supple, no jugular venous distention. No thyroid enlargement, no tenderness.  LUNGS: Normal breath sounds bilaterally, no wheezing, rales, rhonchi. No use of accessory muscles of respiration.  CARDIOVASCULAR: S1, S2 normal. No murmurs, rubs, or gallops.  ABDOMEN: Soft, minimal discomfort in suprapubic area on palpation, but no rebound or guarding, nondistended. Bowel sounds present. No organomegaly or mass.  EXTREMITIES: No cyanosis, clubbing or edema b/l.    NEUROLOGIC: Cranial nerves II through XII are intact. No focal Motor or sensory deficits b/l.  Globally weak.   PSYCHIATRIC: The patient is alert and oriented x 3.  Good affect.  SKIN: No obvious rash, lesion, or ulcer.   Foley catheter in place with tea colored Urine draining.  LABORATORY PANEL:   CBC  Recent Labs Lab 07/02/15 0708  WBC 24.0*  HGB 13.6  HCT 41.2  PLT 86*   ------------------------------------------------------------------------------------------------------------------  Chemistries   Recent Labs Lab 06/29/15 0301  07/02/15 0708  NA 138  < > 140  K 4.5  < > 3.8  CL 104  < > 116*  CO2 24  < > 19*  GLUCOSE 103*  < > 92  BUN 31*  < > 29*  CREATININE 1.73*  < > 1.10  CALCIUM 9.2  < > 7.7*  AST 24  --   --   ALT 21  --   --   ALKPHOS 70  --   --   BILITOT 1.0  --   --   < > =  values in this interval not displayed. ------------------------------------------------------------------------------------------------------------------  Cardiac Enzymes  Recent Labs Lab 06/29/15 0301  TROPONINI 0.04*   ------------------------------------------------------------------------------------------------------------------  RADIOLOGY:  No results found.   ASSESSMENT AND PLAN:   70 yo male w/ hx of HTN, hx of systolic  CHF, BPH w/ urinary retention who presented to the hospital w/ fever and noted to have sepsis secondary to UTI.  #1 . Escherichia coli sepsis due to Escherichia coli UTI -patient presented with fever, tachycardia, hypotension, positive urinalysis and elevated lactic acid. -Continue  Keflex by mouth, white blood cell count as well as kidney function is improving  #2 . Acute pyelonephritis due to Escherichia coli, likely Foley catheter related. -Continue oral Keflex, no discomfort on CVA percussion bilaterally today.   #3 hypotension-secondary to the sepsis and UTI. - improved w/ IV fluids and will monitor. Continue low rate IV fluids at present due to cardiomyopathy  #4 history of BPH with urinary retention-patient presented to the hospital with a chronic indwelling Foley. -Continue Flomax. - appreciate Urology input and they recommend cont. Foley and current care and will need to go home with foley and follow up with them as outpatient.    #5 . Chronic combined systolic and diastolic CHF-the patient is not in congestive heart failure presently. -Holding  beta blockers given the hypotension, holding  Diuretics. Resume when blood pressure has improved  #6 elevated troponin-likely secondary to demand ischemia from hypotension and sepsis. -Follow serial cardiac markers.  #7 Acute Renal Failure - likely ATN from sepsis and hypotension.  - resolved on  IV fluids , stopping IV fluids. Follow creatinine and a morning as well as oral intake closely  #8 Diarrhea - C. Diff (-). PRN Imodium. Unclear if it is related to antibiotic since patient is off meropenem now and still continues to have frequent stools. Add cholestyramine package daily   #9. Generalized weakness, get physical therapist involved. The patient may benefit from skilled nursing facility rehabilitation placement. Asking nurses to get patient out of bed with each meals.    All the records are reviewed and case discussed with Care  Management/Social Workerr. Management plans discussed with the patient, family and they are in agreement.  CODE STATUS: Full  DVT Prophylaxis: Lovenox  TOTAL TIME TAKING CARE OF THIS PATIENT: 35 minutes.   POSSIBLE D/C IN 2-3 DAYS, DEPENDING ON CLINICAL CONDITION.   Katharina Caper M.D on 07/02/2015 at 12:16 PM  Between 7am to 6pm - Pager - [phone]  After 6pm go to [email or web address] - password [email or web address] [facility]  [facility] [place] [facility]  Office  [phone]  CC: Primary care physician; No primary care provider on file.

## 2015-07-03 DIAGNOSIS — N179 Acute kidney failure, unspecified: Secondary | ICD-10-CM

## 2015-07-03 DIAGNOSIS — Z978 Presence of other specified devices: Secondary | ICD-10-CM

## 2015-07-03 DIAGNOSIS — R339 Retention of urine, unspecified: Secondary | ICD-10-CM

## 2015-07-03 DIAGNOSIS — R197 Diarrhea, unspecified: Secondary | ICD-10-CM

## 2015-07-03 DIAGNOSIS — I5042 Chronic combined systolic (congestive) and diastolic (congestive) heart failure: Secondary | ICD-10-CM

## 2015-07-03 DIAGNOSIS — N400 Enlarged prostate without lower urinary tract symptoms: Secondary | ICD-10-CM

## 2015-07-03 DIAGNOSIS — Z960 Presence of urogenital implants: Secondary | ICD-10-CM

## 2015-07-03 LAB — BASIC METABOLIC PANEL
Anion gap: 6 (ref 5–15)
BUN: 27 mg/dL — AB (ref 6–20)
CHLORIDE: 117 mmol/L — AB (ref 101–111)
CO2: 19 mmol/L — AB (ref 22–32)
Calcium: 8.3 mg/dL — ABNORMAL LOW (ref 8.9–10.3)
Creatinine, Ser: 1.15 mg/dL (ref 0.61–1.24)
GFR calc Af Amer: >60 mL/min (ref >60)
GFR calc non Af Amer: >60 mL/min (ref >60)
GLUCOSE: 106 mg/dL — AB (ref 65–99)
POTASSIUM: 3.9 mmol/L (ref 3.5–5.1)
Sodium: 142 mmol/L (ref 135–145)

## 2015-07-03 LAB — CBC
HEMATOCRIT: 41.6 % (ref 40.0–52.0)
Hemoglobin: 13.9 g/dL (ref 13.0–18.0)
MCH: 29 pg (ref 26.0–34.0)
MCHC: 33.5 g/dL (ref 32.0–36.0)
MCV: 86.4 fL (ref 80.0–100.0)
PLATELETS: 76 10*3/uL — AB (ref 150–440)
RBC: 4.81 MIL/uL (ref 4.40–5.90)
RDW: 14.7 % — AB (ref 11.5–14.5)
WBC: 20.6 10*3/uL — ABNORMAL HIGH (ref 3.8–10.6)

## 2015-07-03 MED ORDER — CEPHALEXIN 500 MG PO CAPS: 500.0000 mg | ORAL_CAPSULE | Freq: Three times a day (TID) | ORAL | Status: DC

## 2015-07-03 MED ORDER — LOPERAMIDE HCL 2 MG PO CAPS: 2.0000 mg | ORAL_CAPSULE | ORAL | Status: DC | PRN

## 2015-07-03 MED ORDER — CHOLESTYRAMINE LIGHT 4 G PO PACK: 4.0000 g | PACK | Freq: Every day | ORAL | Status: DC

## 2015-07-03 MED ORDER — ENSURE ENLIVE PO LIQD: 237.0000 mL | Freq: Three times a day (TID) | ORAL | Status: AC

## 2015-07-03 NOTE — Progress Notes (Signed)
[facility] - [place] at [facility]   PATIENT NAME: Casey Murphy    MR#:  [id number]  DATE OF BIRTH:  04-Aug-1945  SUBJECTIVE:   Patient here due to fever, hypotension and noted to be septic secondary to UTI. BP has remained low. Not on IV fluids due to cardiomyopathy with ejection fraction of 25-30%. Blood cultures are positive for Escherichia coli. Urine culture positive for Escherichia coli. Escherichia coli is pansensitive. Creatinine has been improving from 2.95-1.1 today. Patient denies any pain. Feels overall good, but still weak. Oral intake was poor. Today in the morning. However, improved as they progressed, patient complained of feeling full with a few bites. Foley catheter needs to be replaced or withdrawn by urologist in the nearest future while patient is on antibiotic therapy for Escherichia coli sepsis/acute pyelonephritis. She was evaluated by physical therapist and recommended no physical therapy at home or in the facility  REVIEW OF SYSTEMS:    Review of Systems  Constitutional: Negative for fever and chills.  HENT: Negative for congestion and tinnitus.   Eyes: Negative for blurred vision and double vision.  Respiratory: Negative for cough, shortness of breath and wheezing.   Cardiovascular: Negative for chest pain, orthopnea and PND.  Gastrointestinal: Positive for diarrhea. Negative for nausea, vomiting and abdominal pain.  Genitourinary: Negative for dysuria and hematuria.  Neurological: Positive for weakness (generalized). Negative for dizziness, sensory change and focal weakness.  All other systems reviewed and are negative.   Nutrition: Heart healthy Tolerating Diet: Yes Tolerating PT: Await evaluation   DRUG ALLERGIES:   Allergies  Allergen Reactions  . Lisinopril Other (See Comments)    Reaction: unknown  . Sulfa Antibiotics Hives    VITALS:  Blood pressure 125/78, pulse 67, temperature 99.6 F (37.6 C), temperature source  Oral, resp. rate 18, height  (1.905 m), weight 102.921 kg (226 lb 14.4 oz), SpO2 98 %.  PHYSICAL EXAMINATION:   Physical Exam  GENERAL:  70 y.o.-year-old patient lying in the bed with no acute distress. Right cheek erythema EYES: Pupils equal, round, reactive to light and accommodation. No scleral icterus. Extraocular muscles intact.  HEENT: Head atraumatic, normocephalic. Oropharynx and nasopharynx clear.  NECK:  Supple, no jugular venous distention. No thyroid enlargement, no tenderness.  LUNGS: Normal breath sounds bilaterally, no wheezing, rales, rhonchi. No use of accessory muscles of respiration.  CARDIOVASCULAR: S1, S2 normal. No murmurs, rubs, or gallops.  ABDOMEN: Soft, no pain on palpation, no rebound or guarding, nondistended. Bowel sounds present. No organomegaly or mass.  EXTREMITIES: No cyanosis, clubbing or edema b/l.    NEUROLOGIC: Cranial nerves II through XII are intact. No focal Motor or sensory deficits b/l.     PSYCHIATRIC: The patient is alert and oriented x 3.  Good affect.  SKIN: No obvious rash, lesion, or ulcer.   Foley catheter in place with tea colored Urine draining.  LABORATORY PANEL:   CBC  Recent Labs Lab 07/03/15 0529  WBC 20.6*  HGB 13.9  HCT 41.6  PLT 76*   ------------------------------------------------------------------------------------------------------------------  Chemistries   Recent Labs Lab 06/29/15 0301  07/03/15 0529  NA 138  < > 142  K 4.5  < > 3.9  CL 104  < > 117*  CO2 24  < > 19*  GLUCOSE 103*  < > 106*  BUN 31*  < > 27*  CREATININE 1.73*  < > 1.15  CALCIUM 9.2  < > 8.3*  AST 24  --   --  ALT 21  --   --   ALKPHOS 70  --   --   BILITOT 1.0  --   --   < > = values in this interval not displayed. ------------------------------------------------------------------------------------------------------------------  Cardiac Enzymes  Recent Labs Lab 06/29/15 0301  TROPONINI 0.04*    ------------------------------------------------------------------------------------------------------------------  RADIOLOGY:  No results found.   ASSESSMENT AND PLAN:   70 yo male w/ hx of HTN, hx of systolic CHF, BPH w/ urinary retention who presented to the hospital w/ fever and noted to have sepsis secondary to UTI.  #1 . Escherichia coli sepsis due to Escherichia coli UTI -patient presented with fever, tachycardia, hypotension, positive urinalysis and elevated lactic acid. -Continue  Keflex by mouth, white blood cell count as well as kidney function is improving, follow-up WBC count as outpatient  #2 . Acute pyelonephritis due to Escherichia coli, likely Foley catheter related. -Continue oral Keflex, no discomfort on CVA percussion bilaterally. Patient will need to complete 14 day antibiotic therapy orally, this was discussed this patient. Foley catheter needs to be withdrawn or discontinued by urologist while patient is still on antibiotic therapy.   #3 hypotension-secondary to the sepsis and UTI. - improved w/ IV fluids . Of IV fluids due to cardiomyopathy. Patient's blood pressure remained stable. Patient's blood pressure medications are going to be resumed, but patient's wife was advised to check patient's blood pressure and do not administer medications if systolic blood pressure is below 130  #4 history of BPH with urinary retention-patient presented to the hospital with a chronic indwelling Foley. -Continue Flomax. - appreciate Urology input and they recommend cont. Foley and current care and will need to go home with foley and follow up with them as outpatient.  patient's family, however, would like to see urologist in [place], Dr. Alben Deeds, patient's primary urologist , but is unclear how soon they will be able to make an appointment with him , discussed this patient's wife extensively. All questions were answered . Appointment with local urologist is also late in the next few  days , if patient is agreeable    #5 . Chronic combined systolic and diastolic CHF-the patient is not in congestive heart failure present, will be resuming  blood pressure medications when blood pressure has improved, as mentioned above, patient's wife was advised to check blood pressure readings before medication is given.   #6 elevated troponin-likely secondary to demand ischemia from hypotension and sepsis. -Echocardiogram was checked, was found to be abnormal with ejection fraction of 25-30% and signs of diastolic dysfunction.   #7 Acute Renal Failure - likely ATN from sepsis and hypotension and questionable pyelonephritis .  - resolved on  IV fluids, antibiotic therapy,  now off IVF, creatinine remains stable.   #8 Diarrhea - C. Diff (-). PRN Imodium. Unclear if it was  related to antibiotic, still frequency is decreasing since patient is given  cholestyramine package daily, continue the same. Follow up with gastroenterologist as outpatient.    #9. Generalized weakness, physical therapist recommends no physical therapy at home on the rehabilitation facility.    All the records are reviewed and case discussed with Care Management/Social Workerr. Management plans discussed with the patient, family and they are in agreement.  CODE STATUS: Full  DVT Prophylaxis: Lovenox  TOTAL TIME TAKING CARE OF THIS PATIENT: 50 minutes.  . Discussed with patient's wife approximately 10-15 minutes  POSSIBLE D/C IN 2-3 DAYS, DEPENDING ON CLINICAL CONDITION.   Katharina Caper M.D on 07/03/2015 at 2:34 PM  Between 7am to 6pm - Pager - [phone]  After 6pm go to [email or web address] - password [email or web address] [facility]  [facility]  Office  [phone]  CC: Primary care physician; No primary care provider on file.

## 2015-07-03 NOTE — Progress Notes (Signed)
Patient discharged to home as ordered. Patient and wife given discharge instructions. Patient foley to remain in place until seen by urology on 07/06/2015. Foley draining clear yellow urine. IV's discontinued as ordered. Patient VSS no acute distress noted.

## 2015-07-03 NOTE — Discharge Summary (Signed)
[facility] - [place] at [facility]   PATIENT NAME: Casey Murphy    MR#:  [id number]  DATE OF BIRTH:  [birth year]  DATE OF ADMISSION:  06/29/2015 ADMITTING PHYSICIAN: Ihor Austin, MD  DATE OF DISCHARGE: 07/03/2015  6:30 PM  PRIMARY CARE PHYSICIAN: No primary care provider on file.     ADMISSION DIAGNOSIS:  Urinary retention [R33.9] UTI (lower urinary tract infection) [N39.0] Acute kidney injury (HCC) [N17.9] Hematuria [R31.9] Severe sepsis (HCC) [A41.9, R65.20]  DISCHARGE DIAGNOSIS:  Principal Problem:   Sepsis (HCC) Active Problems:   Acute pyelonephritis   Hypotension   Acute renal failure (HCC)   Diarrhea   Urinary retention   BPH (benign prostatic hyperplasia)   Foley catheter in place   Chronic combined systolic and diastolic CHF (congestive heart failure) (HCC)   SECONDARY DIAGNOSIS:   Past Medical History  Diagnosis Date  . Hypertension   . BPH (benign prostatic hyperplasia)   . CHF (congestive heart failure) (HCC)     .pro HOSPITAL COURSE:  Patient presented with fever, hypotension and noted to be septic secondary to UTI.  Blood cultures were noted to be positive for Escherichia coli. Urine culture positive for Escherichia coli. Escherichia coli is pansensitive. Creatinine has improved from 2.95-1.15 on the day of discharge.  Foley catheter was recommended to be replaced or withdrawn by urologist in the nearest future while patient is on antibiotic therapy for Escherichia coli sepsis/acute pyelonephritis. The patient was evaluated by physical therapist and felt that he does not need rehabilitation. Patient's oral intake remained poor while in the hospital and he remained relatively hypotensive, patient and his wife were recommended to check BP before taking any blood pressure medication and space medications out during day, not tro take any BP medications if blood pressure is lower than 130 systolic. Patient's intravenous antibiotics  were changed to oral Keflex and patient was adviced to continue them for  12 more days, follow up with urologist and PCP was recommended within one week. Discussion  By problem #1 . Escherichia coli sepsis due to Escherichia coli UTI -patient presented with fever, tachycardia, hypotension, positive urinalysis and elevated lactic acid level. Blood cultures on arrival were positive for e. Coli, pansensitive. Patient was continued on Rocephin initially, then changed to Keflex by mouth, white blood cell count as well as kidney function has improved, follow-up WBC count as outpatient.   #2 . Acute pyelonephritis due to Escherichia coli, Foley catheter related. Continue oral Keflex, no discomfort on CVA percussion bilaterally with antibiotic therapy. Patient will need to complete 14 day antibiotic therapy orally, this was discussed with the patient, 12 more days upon dc home. Foley catheter needs to be withdrawn or discontinued by urologist while patient is still on antibiotic therapy. CT of abdomen without contrast showed no hydronephrosis or stones., but enlarged prostate gland. Patient was seen by urologist, who recommended to continue Flomax and Foley and follow up for a voiding trial with him or primary urologist.   #3 hypotension-secondary to the sepsis and UTI. - improved w/ IV fluids . Of IV fluids due to cardiomyopathy. Patient's blood pressure remains stable. Patient's blood pressure medications are going to be resumed, but patient's wife was advised to check patient's blood pressure and do not administer medications if systolic blood pressure is below 130  #4 history of BPH with urinary retention-patient presented to the hospital with a chronic indwelling Foley. -Continue Flomax. - appreciate Urology input and they recommend cont. Foley and current care  and will need to go home with foley and follow up with them as outpatient. Patient's family, however, would like patient  to see urologist in  [place], Dr. Alben Deeds, patient's primary urologist , but is unclear how soon they will be able to make an appointment with him , discussed this patient's wife extensively. All questions were answered . Appointment with local urologist is arranged within the next few days ,  patient was agreeable   #5 . Chronic combined systolic and diastolic CHF-the patient was not in acute congestive heart failure while he was in the hospital, will be resuming blood pressure medications when blood pressure has improved, as mentioned above, patient's wife was advised to check blood pressure readings before medications are given.   #6 elevated troponin-likely secondary to demand ischemia from hypotension and sepsis. -Echocardiogram was checked, was found to be abnormal with ejection fraction of 25-30% and signs of diastolic dysfunction, pagtient is to follow up with primary cardiology as well, d/w wife today.   #7 Acute Renal Failure - likely ATN from sepsis and hypotension and likely pyelonephritis .  - resolved on IV fluids, antibiotic therapy, now off IVF, creatinine remains stable.   #8 Diarrhea - C. Diff (-). Unclear if it was related to antibiotic, still frequency is decreasing since patient is given cholestyramine package daily, continue the same. Follow up with gastroenterologist as outpatient. C. Diff negative   #9. Generalized weakness, physical therapist recommends no intervention  #10 poor po intake, no problems with PO intake at home, as per wife, Gi consult as needed     DISCHARGE CONDITIONS:   stable  CONSULTS OBTAINED:  Treatment Team:  Hildred Laser, MD  DRUG ALLERGIES:   Allergies  Allergen Reactions  . Lisinopril Other (See Comments)    Reaction: unknown  . Sulfa Antibiotics Hives    DISCHARGE MEDICATIONS:   Discharge Medication List as of 07/03/2015  1:42 PM    START taking these medications   Details  cephALEXin (KEFLEX) 500 MG capsule Take 1 capsule (500 mg  total) by mouth every 8 (eight) hours., Starting 07/03/2015, Until Discontinued, Normal    cholestyramine light (PREVALITE) 4 g packet Take 1 packet (4 g total) by mouth daily., Starting 07/03/2015, Until Discontinued, Normal    feeding supplement, ENSURE ENLIVE, (ENSURE ENLIVE) LIQD Take 237 mLs by mouth 3 (three) times daily between meals., Starting 07/03/2015, Until Discontinued, Normal    loperamide (IMODIUM) 2 MG capsule Take 1 capsule (2 mg total) by mouth every 4 (four) hours as needed for diarrhea or loose stools., Starting 07/03/2015, Until Discontinued, Normal      CONTINUE these medications which have NOT CHANGED   Details  aspirin EC 81 MG tablet Take 81 mg by mouth daily., Until Discontinued, Historical Med    losartan (COZAAR) 100 MG tablet Take 100 mg by mouth daily., Until Discontinued, Historical Med    metoprolol succinate (TOPROL-XL) 50 MG 24 hr tablet Take 50 mg by mouth daily. Take with or immediately following a meal., Until Discontinued, Historical Med    Multiple Vitamin (MULTIVITAMIN WITH MINERALS) TABS tablet Take 1 tablet by mouth daily., Until Discontinued, Historical Med    tamsulosin (FLOMAX) 0.4 MG CAPS capsule Take 0.4 mg by mouth daily., Until Discontinued, Historical Med         DISCHARGE INSTRUCTIONS:    Follow up with PCP, urology, cardiology, follow WBC count, Cr closely  If you experience worsening of your admission symptoms, develop shortness of breath, life threatening  emergency, suicidal or homicidal thoughts you must seek medical attention immediately by calling 911 or calling your MD immediately  if symptoms less severe.  You Must read complete instructions/literature along with all the possible adverse reactions/side effects for all the Medicines you take and that have been prescribed to you. Take any new Medicines after you have completely understood and accept all the possible adverse reactions/side effects.   Please note  You were cared  for by a hospitalist during your hospital stay. If you have any questions about your discharge medications or the care you received while you were in the hospital after you are discharged, you can call the unit and asked to speak with the hospitalist on call if the hospitalist that took care of you is not available. Once you are discharged, your primary care physician will handle any further medical issues. Please note that NO REFILLS for any discharge medications will be authorized once you are discharged, as it is imperative that you return to your primary care physician (or establish a relationship with a primary care physician if you do not have one) for your aftercare needs so that they can reassess your need for medications and monitor your lab values.    Today   CHIEF COMPLAINT:   Chief Complaint  Patient presents with  . Fever  . Code Sepsis    HISTORY OF PRESENT ILLNESS:  Casey Murphy  is a 70 y.o. male with a known history of CHF, BPH, HTN who  presented with fever, hypotension and noted to be septic secondary to UTI.  Blood cultures were noted to be positive for Escherichia coli. Urine culture positive for Escherichia coli. Escherichia coli is pansensitive. Creatinine has improved from 2.95-1.15 on the day of discharge.  Foley catheter was recommended to be replaced or withdrawn by urologist in the nearest future while patient is on antibiotic therapy for Escherichia coli sepsis/acute pyelonephritis. The patient was evaluated by physical therapist and felt that he does not need rehabilitation. Patient's oral intake remained poor while in the hospital and he remained relatively hypotensive, patient and his wife were recommended to check BP before taking any blood pressure medication and space medications out during day, not tro take any BP medications if blood pressure is lower than 130 systolic. Patient's intravenous antibiotics were changed to oral Keflex and patient was adviced to  continue them for  12 more days, follow up with urologist and PCP was recommended within one week. Discussion  By problem #1 . Escherichia coli sepsis due to Escherichia coli UTI -patient presented with fever, tachycardia, hypotension, positive urinalysis and elevated lactic acid level. Blood cultures on arrival were positive for e. Coli, pansensitive. Patient was continued on Rocephin initially, then changed to Keflex by mouth, white blood cell count as well as kidney function has improved, follow-up WBC count as outpatient.   #2 . Acute pyelonephritis due to Escherichia coli, Foley catheter related. Continue oral Keflex, no discomfort on CVA percussion bilaterally with antibiotic therapy. Patient will need to complete 14 day antibiotic therapy orally, this was discussed with the patient, 12 more days upon dc home. Foley catheter needs to be withdrawn or discontinued by urologist while patient is still on antibiotic therapy. CT of abdomen without contrast showed no hydronephrosis or stones., but enlarged prostate gland. Patient was seen by urologist, who recommended to continue Flomax and Foley and follow up for a voiding trial with him or primary urologist.   #3 hypotension-secondary to the sepsis and UTI. -  improved w/ IV fluids . Of IV fluids due to cardiomyopathy. Patient's blood pressure remains stable. Patient's blood pressure medications are going to be resumed, but patient's wife was advised to check patient's blood pressure and do not administer medications if systolic blood pressure is below 130  #4 history of BPH with urinary retention-patient presented to the hospital with a chronic indwelling Foley. -Continue Flomax. - appreciate Urology input and they recommend cont. Foley and current care and will need to go home with foley and follow up with them as outpatient. Patient's family, however, would like patient  to see urologist in [place], Dr. Alben Deeds, patient's primary urologist , but is  unclear how soon they will be able to make an appointment with him , discussed this patient's wife extensively. All questions were answered . Appointment with local urologist is arranged within the next few days ,  patient was agreeable   #5 . Chronic combined systolic and diastolic CHF-the patient was not in acute congestive heart failure while he was in the hospital, will be resuming blood pressure medications when blood pressure has improved, as mentioned above, patient's wife was advised to check blood pressure readings before medications are given.   #6 elevated troponin-likely secondary to demand ischemia from hypotension and sepsis. -Echocardiogram was checked, was found to be abnormal with ejection fraction of 25-30% and signs of diastolic dysfunction, pagtient is to follow up with primary cardiology as well, d/w wife today.   #7 Acute Renal Failure - likely ATN from sepsis and hypotension and likely pyelonephritis .  - resolved on IV fluids, antibiotic therapy, now off IVF, creatinine remains stable.   #8 Diarrhea - C. Diff (-). Unclear if it was related to antibiotic, still frequency is decreasing since patient is given cholestyramine package daily, continue the same. Follow up with gastroenterologist as outpatient. C. Diff negative   #9. Generalized weakness, physical therapist recommends no intervention  #10 poor po intake, no problems with PO intake at home, as per wife, Gi consult as needed      VITAL SIGNS:  Blood pressure 125/78, pulse 67, temperature 99.6 F (37.6 C), temperature source Oral, resp. rate 18, height  (1.905 m), weight 102.921 kg (226 lb 14.4 oz), SpO2 98 %.  I/O:   Intake/Output Summary (Last 24 hours) at 07/03/15 1930 Last data filed at 07/03/15 1238  Gross per 24 hour  Intake      0 ml  Output   1100 ml  Net  -1100 ml    PHYSICAL EXAMINATION:  GENERAL:  71 y.o.-year-old patient lying in the bed with no acute distress.  EYES: Pupils  equal, round, reactive to light and accommodation. No scleral icterus. Extraocular muscles intact.  HEENT: Head atraumatic, normocephalic. Oropharynx and nasopharynx clear.  NECK:  Supple, no jugular venous distention. No thyroid enlargement, no tenderness.  LUNGS: Normal breath sounds bilaterally, no wheezing, rales,rhonchi or crepitation. No use of accessory muscles of respiration.  CARDIOVASCULAR: S1, S2 normal. No murmurs, rubs, or gallops.  ABDOMEN: Soft, non-tender, non-distended. Bowel sounds present. No organomegaly or mass.  EXTREMITIES: No pedal edema, cyanosis, or clubbing.  NEUROLOGIC: Cranial nerves II through XII are intact. Muscle strength 5/5 in all extremities. Sensation intact. Gait not checked.  PSYCHIATRIC: The patient is alert and oriented x 3.  SKIN: No obvious rash, lesion, or ulcer.   DATA REVIEW:   CBC  Recent Labs Lab 07/03/15 0529  WBC 20.6*  HGB 13.9  HCT 41.6  PLT 76*  Chemistries   Recent Labs Lab 06/29/15 0301  07/03/15 0529  NA 138  < > 142  K 4.5  < > 3.9  CL 104  < > 117*  CO2 24  < > 19*  GLUCOSE 103*  < > 106*  BUN 31*  < > 27*  CREATININE 1.73*  < > 1.15  CALCIUM 9.2  < > 8.3*  AST 24  --   --   ALT 21  --   --   ALKPHOS 70  --   --   BILITOT 1.0  --   --   < > = values in this interval not displayed.  Cardiac Enzymes  Recent Labs Lab 06/29/15 0301  TROPONINI 0.04*    Microbiology Results  Results for orders placed or performed during the hospital encounter of 06/29/15  Culture, blood (routine x 2)     Status: None   Collection Time: 06/29/15  3:00 AM  Result Value Ref Range Status   Specimen Description BLOOD RIGHT ANTECUBITAL  Final   Special Requests BOTTLES DRAWN AEROBIC AND ANAEROBIC  Final   Culture  Setup Time   Final    GRAM NEGATIVE RODS IN BOTH AEROBIC AND ANAEROBIC BOTTLES CRITICAL RESULT CALLED TO, READ BACK BY AND VERIFIED WITH: KAREN HAYES AT 1437 ON 06/29/15 DLV    Culture   Final    ESCHERICHIA  COLI IN BOTH AEROBIC AND ANAEROBIC BOTTLES    Report Status 07/01/2015 FINAL  Final   Organism ID, Bacteria ESCHERICHIA COLI  Final      Susceptibility   Escherichia coli - MIC*    AMPICILLIN <=2 SENSITIVE Sensitive     CEFTAZIDIME <=1 SENSITIVE Sensitive     CEFAZOLIN <=4 SENSITIVE Sensitive     CEFTRIAXONE <=1 SENSITIVE Sensitive     CIPROFLOXACIN <=0.25 SENSITIVE Sensitive     GENTAMICIN <=1 SENSITIVE Sensitive     IMIPENEM <=0.25 SENSITIVE Sensitive     TRIMETH/SULFA <=20 SENSITIVE Sensitive     PIP/TAZO Value in next row Sensitive      SENSITIVE<=4    CEFEPIME Value in next row Sensitive      SENSITIVE<=1    AMPICILLIN/SULBACTAM Value in next row Sensitive      SENSITIVE<=2    Extended ESBL Value in next row Sensitive      SENSITIVE<=2    * ESCHERICHIA COLI  Blood Culture ID Panel (Reflexed)     Status: Abnormal   Collection Time: 06/29/15  3:00 AM  Result Value Ref Range Status   Enterococcus species NOT DETECTED NOT DETECTED Final   Listeria monocytogenes NOT DETECTED NOT DETECTED Final   Staphylococcus species NOT DETECTED NOT DETECTED Final   Staphylococcus aureus NOT DETECTED NOT DETECTED Final   Streptococcus species NOT DETECTED NOT DETECTED Final   Streptococcus agalactiae NOT DETECTED NOT DETECTED Final   Streptococcus pneumoniae NOT DETECTED NOT DETECTED Final   Streptococcus pyogenes NOT DETECTED NOT DETECTED Final   Acinetobacter baumannii NOT DETECTED NOT DETECTED Final   Enterobacteriaceae species DETECTED (A) NOT DETECTED Final    Comment: CRITICAL RESULT CALLED TO, READ BACK BY AND VERIFIED WITH: KAREN HAYES FOR E. COLI AND ENTEROBACTERIACEAE AT 1437 ON 06/29/15. DLV    Enterobacter cloacae complex NOT DETECTED NOT DETECTED Final   Escherichia coli DETECTED (A) NOT DETECTED Final   Klebsiella oxytoca NOT DETECTED NOT DETECTED Final   Klebsiella pneumoniae NOT DETECTED NOT DETECTED Final   Proteus species NOT DETECTED NOT DETECTED Final   Serratia  marcescens NOT DETECTED NOT DETECTED Final   Haemophilus influenzae NOT DETECTED NOT DETECTED Final   Neisseria meningitidis NOT DETECTED NOT DETECTED Final   Pseudomonas aeruginosa NOT DETECTED NOT DETECTED Final   Candida albicans NOT DETECTED NOT DETECTED Final   Candida glabrata NOT DETECTED NOT DETECTED Final   Candida krusei NOT DETECTED NOT DETECTED Final   Candida parapsilosis NOT DETECTED NOT DETECTED Final   Candida tropicalis NOT DETECTED NOT DETECTED Final   Carbapenem resistance NOT DETECTED NOT DETECTED Final   Methicillin resistance NOT DETECTED NOT DETECTED Final   Vancomycin resistance NOT DETECTED NOT DETECTED Final  Culture, blood (routine x 2)     Status: None   Collection Time: 06/29/15  3:01 AM  Result Value Ref Range Status   Specimen Description BLOOD LEFT ANTECUBITAL  Final   Special Requests BOTTLES DRAWN AEROBIC AND ANAEROBIC  Final   Culture  Setup Time   Final    GRAM NEGATIVE RODS IN BOTH AEROBIC AND ANAEROBIC BOTTLES CRITICAL VALUE NOTED.  VALUE IS CONSISTENT WITH PREVIOUSLY REPORTED AND CALLED VALUE.    Culture   Final    ESCHERICHIA COLI IN BOTH AEROBIC AND ANAEROBIC BOTTLES    Report Status 07/01/2015 FINAL  Final   Organism ID, Bacteria ESCHERICHIA COLI  Final      Susceptibility   Escherichia coli - MIC*    AMPICILLIN <=2 SENSITIVE Sensitive     CEFTAZIDIME <=1 SENSITIVE Sensitive     CEFAZOLIN <=4 SENSITIVE Sensitive     CEFTRIAXONE <=1 SENSITIVE Sensitive     CIPROFLOXACIN <=0.25 SENSITIVE Sensitive     GENTAMICIN <=1 SENSITIVE Sensitive     IMIPENEM <=0.25 SENSITIVE Sensitive     TRIMETH/SULFA <=20 SENSITIVE Sensitive     CEFEPIME Value in next row Sensitive      SENSITIVE<=1    PIP/TAZO Value in next row Sensitive      SENSITIVE<=4    Extended ESBL Value in next row Sensitive      SENSITIVE<=4    AMPICILLIN/SULBACTAM Value in next row Sensitive      SENSITIVE<=2    * ESCHERICHIA COLI  Urine culture     Status: None    Collection Time: 06/29/15  3:29 AM  Result Value Ref Range Status   Specimen Description URINE, RANDOM  Final   Special Requests NONE  Final   Culture >=100,000 COLONIES/mL ESCHERICHIA COLI  Final   Report Status 07/01/2015 FINAL  Final   Organism ID, Bacteria ESCHERICHIA COLI  Final      Susceptibility   Escherichia coli - MIC*    AMPICILLIN <=2 SENSITIVE Sensitive     CEFTAZIDIME <=1 SENSITIVE Sensitive     CEFAZOLIN <=4 SENSITIVE Sensitive     CEFTRIAXONE <=1 SENSITIVE Sensitive     CIPROFLOXACIN <=0.25 SENSITIVE Sensitive     GENTAMICIN <=1 SENSITIVE Sensitive     IMIPENEM <=0.25 SENSITIVE Sensitive     TRIMETH/SULFA <=20 SENSITIVE Sensitive     NITROFURANTOIN Value in next row Sensitive      SENSITIVE<=16    PIP/TAZO Value in next row Sensitive      SENSITIVE<=4    * >=100,000 COLONIES/mL ESCHERICHIA COLI  Culture, blood (x 2)     Status: None (Preliminary result)   Collection Time: 06/29/15  4:46 PM  Result Value Ref Range Status   Specimen Description BLOOD LEFT HAND  Final   Special Requests BOTTLES DRAWN AEROBIC AND ANAEROBIC 1CC  Final   Culture NO GROWTH 4 DAYS  Final   Report Status PENDING  Incomplete  Culture, blood (x 2)     Status: None (Preliminary result)   Collection Time: 06/29/15  5:03 PM  Result Value Ref Range Status   Specimen Description BLOOD RIGHT HAND  Final   Special Requests   Final    BOTTLES DRAWN AEROBIC AND ANAEROBIC  AERO 2CC ANA 1CC   Culture NO GROWTH 4 DAYS  Final   Report Status PENDING  Incomplete  C difficile quick scan w PCR reflex     Status: None   Collection Time: 06/30/15  1:20 AM  Result Value Ref Range Status   C Diff antigen NEGATIVE NEGATIVE Final   C Diff toxin NEGATIVE NEGATIVE Final   C Diff interpretation Negative for C. difficile  Final    RADIOLOGY:  No results found.  EKG:   Orders placed or performed during the hospital encounter of 06/29/15  . EKG 12-Lead  . EKG 12-Lead  . EKG 12-Lead  . EKG 12-Lead       Management plans discussed with the patient, family and they are in agreement.  CODE STATUS:     Code Status Orders        Start     Ordered   06/29/15 0601  Full code   Continuous     06/29/15 0602    Code Status History    Date Active Date Inactive Code Status Order ID Comments User Context   This patient has a current code status but no historical code status.      TOTAL TIME TAKING CARE OF THIS PATIENT: 40 minutes.    Katharina Caper M.D on 07/03/2015 at 7:30 PM  Between 7am to 6pm - Pager - [phone]  After 6pm go to [email or web address] - password [email or web address] [facility]  [place] [facility]  Office  [phone]  CC: Primary care physician; No primary care provider on file.

## 2015-07-03 NOTE — Care Management Important Message (Signed)
Important Message  Patient Details  Name: Casey Murphy MRN: [id number] Date of Birth: 1946-04-07   Medicare Important Message Given:  Yes    Olegario Messier A Daymion Nazaire 07/03/2015, 11:18 AM

## 2015-07-04 ENCOUNTER — Emergency Department: Payer: Medicare HMO

## 2015-07-04 ENCOUNTER — Observation Stay
Admission: EM | Admit: 2015-07-04 | Discharge: 2015-07-07 | Disposition: A | Discharge status: Home / Self Care | Payer: Medicare HMO | Attending: Internal Medicine | Admitting: Internal Medicine

## 2015-07-04 ENCOUNTER — Encounter: Payer: Self-pay | Admitting: *Deleted

## 2015-07-04 DIAGNOSIS — R0602 Shortness of breath: Secondary | ICD-10-CM | POA: Diagnosis not present

## 2015-07-04 DIAGNOSIS — I10 Essential (primary) hypertension: Secondary | ICD-10-CM | POA: Insufficient documentation

## 2015-07-04 DIAGNOSIS — N401 Enlarged prostate with lower urinary tract symptoms: Secondary | ICD-10-CM | POA: Insufficient documentation

## 2015-07-04 DIAGNOSIS — R339 Retention of urine, unspecified: Secondary | ICD-10-CM | POA: Diagnosis not present

## 2015-07-04 DIAGNOSIS — R112 Nausea with vomiting, unspecified: Secondary | ICD-10-CM | POA: Diagnosis not present

## 2015-07-04 DIAGNOSIS — I252 Old myocardial infarction: Secondary | ICD-10-CM | POA: Insufficient documentation

## 2015-07-04 DIAGNOSIS — I5022 Chronic systolic (congestive) heart failure: Secondary | ICD-10-CM

## 2015-07-04 DIAGNOSIS — R109 Unspecified abdominal pain: Secondary | ICD-10-CM | POA: Diagnosis not present

## 2015-07-04 DIAGNOSIS — I214 Non-ST elevation (NSTEMI) myocardial infarction: Secondary | ICD-10-CM | POA: Diagnosis present

## 2015-07-04 DIAGNOSIS — R778 Other specified abnormalities of plasma proteins: Secondary | ICD-10-CM

## 2015-07-04 DIAGNOSIS — I2510 Atherosclerotic heart disease of native coronary artery without angina pectoris: Secondary | ICD-10-CM | POA: Insufficient documentation

## 2015-07-04 DIAGNOSIS — A419 Sepsis, unspecified organism: Secondary | ICD-10-CM | POA: Insufficient documentation

## 2015-07-04 DIAGNOSIS — R7989 Other specified abnormal findings of blood chemistry: Secondary | ICD-10-CM

## 2015-07-04 DIAGNOSIS — R0600 Dyspnea, unspecified: Secondary | ICD-10-CM

## 2015-07-04 DIAGNOSIS — I429 Cardiomyopathy, unspecified: Secondary | ICD-10-CM | POA: Insufficient documentation

## 2015-07-04 DIAGNOSIS — R748 Abnormal levels of other serum enzymes: Secondary | ICD-10-CM | POA: Insufficient documentation

## 2015-07-04 DIAGNOSIS — D72829 Elevated white blood cell count, unspecified: Secondary | ICD-10-CM | POA: Diagnosis not present

## 2015-07-04 DIAGNOSIS — L03211 Cellulitis of face: Secondary | ICD-10-CM | POA: Insufficient documentation

## 2015-07-04 DIAGNOSIS — I77811 Abdominal aortic ectasia: Secondary | ICD-10-CM | POA: Insufficient documentation

## 2015-07-04 DIAGNOSIS — Z79899 Other long term (current) drug therapy: Secondary | ICD-10-CM | POA: Insufficient documentation

## 2015-07-04 DIAGNOSIS — Z87891 Personal history of nicotine dependence: Secondary | ICD-10-CM | POA: Insufficient documentation

## 2015-07-04 DIAGNOSIS — N289 Disorder of kidney and ureter, unspecified: Secondary | ICD-10-CM | POA: Insufficient documentation

## 2015-07-04 DIAGNOSIS — R6881 Early satiety: Secondary | ICD-10-CM

## 2015-07-04 DIAGNOSIS — B029 Zoster without complications: Secondary | ICD-10-CM

## 2015-07-04 DIAGNOSIS — N390 Urinary tract infection, site not specified: Secondary | ICD-10-CM | POA: Insufficient documentation

## 2015-07-04 DIAGNOSIS — E785 Hyperlipidemia, unspecified: Secondary | ICD-10-CM | POA: Insufficient documentation

## 2015-07-04 DIAGNOSIS — R531 Weakness: Secondary | ICD-10-CM | POA: Insufficient documentation

## 2015-07-04 DIAGNOSIS — Z7982 Long term (current) use of aspirin: Secondary | ICD-10-CM | POA: Insufficient documentation

## 2015-07-04 DIAGNOSIS — R918 Other nonspecific abnormal finding of lung field: Secondary | ICD-10-CM | POA: Insufficient documentation

## 2015-07-04 DIAGNOSIS — K5730 Diverticulosis of large intestine without perforation or abscess without bleeding: Secondary | ICD-10-CM | POA: Insufficient documentation

## 2015-07-04 DIAGNOSIS — I509 Heart failure, unspecified: Secondary | ICD-10-CM | POA: Insufficient documentation

## 2015-07-04 DIAGNOSIS — R21 Rash and other nonspecific skin eruption: Secondary | ICD-10-CM | POA: Diagnosis not present

## 2015-07-04 DIAGNOSIS — R140 Abdominal distension (gaseous): Secondary | ICD-10-CM | POA: Insufficient documentation

## 2015-07-04 DIAGNOSIS — D696 Thrombocytopenia, unspecified: Secondary | ICD-10-CM | POA: Insufficient documentation

## 2015-07-04 DIAGNOSIS — Z8614 Personal history of Methicillin resistant Staphylococcus aureus infection: Secondary | ICD-10-CM | POA: Insufficient documentation

## 2015-07-04 HISTORY — DX: Methicillin resistant Staphylococcus aureus infection as the cause of diseases classified elsewhere: B95.62

## 2015-07-04 HISTORY — DX: Atherosclerotic heart disease of native coronary artery without angina pectoris: I25.10

## 2015-07-04 HISTORY — DX: Bacteremia: R78.81

## 2015-07-04 HISTORY — DX: Old myocardial infarction: I25.2

## 2015-07-04 LAB — CBC WITH DIFFERENTIAL/PLATELET
BAND NEUTROPHILS: 6 %
BLASTS: 0 %
Basophils Absolute: 0 10*3/uL (ref 0–0.1)
Basophils Relative: 0 %
EOS ABS: 0 10*3/uL (ref 0–0.7)
Eosinophils Relative: 0 %
HEMATOCRIT: 44.9 % (ref 40.0–52.0)
HEMOGLOBIN: 14.8 g/dL (ref 13.0–18.0)
LYMPHS PCT: 18 %
Lymphs Abs: 3.5 10*3/uL (ref 1.0–3.6)
MCH: 28.3 pg (ref 26.0–34.0)
MCHC: 33.1 g/dL (ref 32.0–36.0)
MCV: 85.5 fL (ref 80.0–100.0)
MONOS PCT: 7 %
Metamyelocytes Relative: 2 %
Monocytes Absolute: 1.4 10*3/uL — ABNORMAL HIGH (ref 0.2–1.0)
Myelocytes: 0 %
NEUTROS PCT: 67 %
NRBC: 0 /100{WBCs}
Neutro Abs: 14.6 10*3/uL — ABNORMAL HIGH (ref 1.4–6.5)
OTHER: 0 %
PROMYELOCYTES ABS: 0 %
Platelets: 92 10*3/uL — ABNORMAL LOW (ref 150–440)
RBC: 5.25 MIL/uL (ref 4.40–5.90)
RDW: 14.8 % — ABNORMAL HIGH (ref 11.5–14.5)
WBC: 19.5 10*3/uL — AB (ref 3.8–10.6)

## 2015-07-04 LAB — CULTURE, BLOOD (ROUTINE X 2)
CULTURE: NO GROWTH
CULTURE: NO GROWTH

## 2015-07-04 LAB — COMPREHENSIVE METABOLIC PANEL
ALK PHOS: 166 U/L — AB (ref 38–126)
ALT: 49 U/L (ref 17–63)
AST: 52 U/L — ABNORMAL HIGH (ref 15–41)
Albumin: 2.9 g/dL — ABNORMAL LOW (ref 3.5–5.0)
Anion gap: 4 — ABNORMAL LOW (ref 5–15)
BILIRUBIN TOTAL: 0.9 mg/dL (ref 0.3–1.2)
BUN: 23 mg/dL — AB (ref 6–20)
CALCIUM: 8.2 mg/dL — AB (ref 8.9–10.3)
CO2: 22 mmol/L (ref 22–32)
Chloride: 111 mmol/L (ref 101–111)
Creatinine, Ser: 1.11 mg/dL (ref 0.61–1.24)
GFR calc Af Amer: >60 mL/min (ref >60)
GLUCOSE: 109 mg/dL — AB (ref 65–99)
Potassium: 3.7 mmol/L (ref 3.5–5.1)
SODIUM: 137 mmol/L (ref 135–145)
TOTAL PROTEIN: 6.9 g/dL (ref 6.5–8.1)

## 2015-07-04 LAB — URINALYSIS COMPLETE WITH MICROSCOPIC (ARMC ONLY)
BILIRUBIN URINE: NEGATIVE
Bacteria, UA: NONE SEEN
GLUCOSE, UA: NEGATIVE mg/dL
KETONES UR: NEGATIVE mg/dL
LEUKOCYTES UA: NEGATIVE
NITRITE: NEGATIVE
PH: 5 (ref 5.0–8.0)
Protein, ur: 100 mg/dL — AB
SPECIFIC GRAVITY, URINE: 1.017 (ref 1.005–1.030)
Squamous Epithelial / LPF: NONE SEEN

## 2015-07-04 LAB — TROPONIN I
TROPONIN I: 0.55 ng/mL — AB (ref <0.031)
Troponin I: 0.57 ng/mL — ABNORMAL HIGH (ref <0.031)

## 2015-07-04 MED ORDER — TAMSULOSIN HCL 0.4 MG PO CAPS
0.4000 mg | ORAL_CAPSULE | Freq: Every day | ORAL | Status: DC
  Administered 2015-07-04 – 2015-07-07 (×4): 0.4 mg via ORAL
  Filled 2015-07-04 (×4): qty 1

## 2015-07-04 MED ORDER — CHOLESTYRAMINE LIGHT 4 G PO PACK
4.0000 g | PACK | Freq: Every day | ORAL | Status: DC
  Administered 2015-07-05 – 2015-07-07 (×3): 4 g via ORAL
  Filled 2015-07-04 (×4): qty 1

## 2015-07-04 MED ORDER — ASPIRIN EC 325 MG PO TBEC
325.0000 mg | DELAYED_RELEASE_TABLET | Freq: Every day | ORAL | Status: DC
  Administered 2015-07-05 – 2015-07-07 (×3): 325 mg via ORAL
  Filled 2015-07-04 (×3): qty 1

## 2015-07-04 MED ORDER — SODIUM CHLORIDE 0.9% FLUSH: 3.0000 mL | INTRAVENOUS | Status: DC | PRN

## 2015-07-04 MED ORDER — SODIUM CHLORIDE 0.9% FLUSH
3.0000 mL | Freq: Two times a day (BID) | INTRAVENOUS | Status: DC
  Administered 2015-07-05 – 2015-07-06 (×2): 3 mL via INTRAVENOUS

## 2015-07-04 MED ORDER — LOSARTAN POTASSIUM 50 MG PO TABS
100.0000 mg | ORAL_TABLET | Freq: Every day | ORAL | Status: DC
  Administered 2015-07-05 – 2015-07-07 (×3): 100 mg via ORAL
  Filled 2015-07-04 (×3): qty 2

## 2015-07-04 MED ORDER — SIMVASTATIN 40 MG PO TABS
40.0000 mg | ORAL_TABLET | Freq: Every day | ORAL | Status: DC
  Administered 2015-07-04 – 2015-07-06 (×3): 40 mg via ORAL
  Filled 2015-07-04 (×3): qty 1

## 2015-07-04 MED ORDER — ACETAMINOPHEN 325 MG PO TABS: 650.0000 mg | ORAL_TABLET | Freq: Four times a day (QID) | ORAL | Status: DC | PRN

## 2015-07-04 MED ORDER — DEXTROSE 5 % IV SOLN
1.0000 g | INTRAVENOUS | Status: DC
  Administered 2015-07-04 – 2015-07-05 (×2): 1 g via INTRAVENOUS
  Filled 2015-07-04 (×3): qty 10

## 2015-07-04 MED ORDER — VALACYCLOVIR HCL 500 MG PO TABS
1000.0000 mg | ORAL_TABLET | Freq: Three times a day (TID) | ORAL | Status: DC
  Administered 2015-07-04 – 2015-07-07 (×9): 1000 mg via ORAL
  Filled 2015-07-04 (×12): qty 2

## 2015-07-04 MED ORDER — ENOXAPARIN SODIUM 40 MG/0.4ML ~~LOC~~ SOLN: 40.0000 mg | SUBCUTANEOUS | Status: DC

## 2015-07-04 MED ORDER — ASPIRIN 81 MG PO CHEW
324.0000 mg | CHEWABLE_TABLET | Freq: Once | ORAL | Status: AC
  Administered 2015-07-04: 324 mg via ORAL
  Filled 2015-07-04: qty 4

## 2015-07-04 MED ORDER — METOPROLOL SUCCINATE ER 50 MG PO TB24
50.0000 mg | ORAL_TABLET | Freq: Every day | ORAL | Status: DC
Start: 2015-07-04 — End: 2015-07-07
  Administered 2015-07-04 – 2015-07-07 (×4): 50 mg via ORAL
  Filled 2015-07-04 (×4): qty 1

## 2015-07-04 MED ORDER — ONDANSETRON HCL 4 MG PO TABS: 4.0000 mg | ORAL_TABLET | Freq: Four times a day (QID) | ORAL | Status: DC | PRN

## 2015-07-04 MED ORDER — SODIUM CHLORIDE 0.9% FLUSH
3.0000 mL | Freq: Two times a day (BID) | INTRAVENOUS | Status: DC
  Administered 2015-07-04 – 2015-07-07 (×4): 3 mL via INTRAVENOUS

## 2015-07-04 MED ORDER — ONDANSETRON HCL 4 MG/2ML IJ SOLN: 4.0000 mg | Freq: Four times a day (QID) | INTRAMUSCULAR | Status: DC | PRN

## 2015-07-04 MED ORDER — ENSURE ENLIVE PO LIQD
237.0000 mL | Freq: Three times a day (TID) | ORAL | Status: DC
  Administered 2015-07-05: 237 mL via ORAL

## 2015-07-04 MED ORDER — SODIUM CHLORIDE 0.9 % IV SOLN: 250.0000 mL | INTRAVENOUS | Status: DC | PRN

## 2015-07-04 MED ORDER — TERAZOSIN HCL 2 MG PO CAPS
2.0000 mg | ORAL_CAPSULE | Freq: Every day | ORAL | Status: DC
  Administered 2015-07-04 – 2015-07-06 (×3): 2 mg via ORAL
  Filled 2015-07-04 (×3): qty 1

## 2015-07-04 MED ORDER — LOPERAMIDE HCL 2 MG PO CAPS: 2.0000 mg | ORAL_CAPSULE | ORAL | Status: DC | PRN

## 2015-07-04 MED ORDER — ADULT MULTIVITAMIN W/MINERALS CH
1.0000 | ORAL_TABLET | Freq: Every day | ORAL | Status: DC
  Administered 2015-07-04 – 2015-07-07 (×4): 1 via ORAL
  Filled 2015-07-04 (×4): qty 1

## 2015-07-04 MED ORDER — VALACYCLOVIR HCL 500 MG PO TABS
1000.0000 mg | ORAL_TABLET | Freq: Once | ORAL | Status: AC
  Administered 2015-07-04: 1000 mg via ORAL
  Filled 2015-07-04: qty 2

## 2015-07-04 MED ORDER — ACETAMINOPHEN 650 MG RE SUPP: 650.0000 mg | Freq: Four times a day (QID) | RECTAL | Status: DC | PRN

## 2015-07-04 NOTE — H&P (Signed)
[facility] - [place] at [facility]   PATIENT NAME: Casey Murphy    MR#:  [id number]  DATE OF BIRTH:  11-22-1945  DATE OF ADMISSION:  07/04/2015  PRIMARY CARE PHYSICIAN: Quita Skye, MD   REQUESTING/REFERRING PHYSICIAN:   CHIEF COMPLAINT:   Chief Complaint  Patient presents with  . Rash  . Shortness of Breath    HISTORY OF PRESENT ILLNESS: Casey Murphy  is a 70 y.o. male with a known history of  Hypertension, BPH, history of congestive heart failure who was recently hospitalized with a urinary tract infection and urinary retention who was just discharged from the hospital yesterday. Patient is brought into the hospital with his wife with complain of some shortness of breath patient reports that his shortness of breath is now resolved. Patient is noted to have a troponin that is elevated at 0.57. Even though they deny any history of coronary artery disease in reviewing his records from [facility] there is distention of presumed coronary artery disease per cardiology. Patient also reports that his developed a rash on the right side of his face going on for few days there was there during his hospitalization. He does have a history of shingles in the past. He otherwise denies any fevers chills no chest pain or shortness of breath his WBC count persistently is elevated.  PAST MEDICAL HISTORY:   Past Medical History  Diagnosis Date  . Hypertension   . BPH (benign prostatic hyperplasia)   . CHF (congestive heart failure) (HCC)   . MRSA bacteremia   . CAD (coronary artery disease)   . MI, old     PAST SURGICAL HISTORY:  Past Surgical History  Procedure Laterality Date  . None      SOCIAL HISTORY:  Social History  Substance Use Topics  . Smoking status: Former Smoker    Types: Cigarettes    Quit date: 05/29/2015  . Smokeless tobacco: Never Used  . Alcohol Use: 0.6 oz/week    1 Standard drinks or equivalent per week     Comment: wine  occasionally    FAMILY HISTORY:  Family History  Problem Relation Age of Onset  . Hypertension      DRUG ALLERGIES:  Allergies  Allergen Reactions  . Lisinopril Swelling  . Sulfa Antibiotics Hives and Swelling    REVIEW OF SYSTEMS:   CONSTITUTIONAL: No fever, positive fatigue and weakness.  EYES: No blurred or double vision.  EARS, NOSE, AND THROAT: No tinnitus or ear pain.  RESPIRATORY: No cough, positive shortness of breath, no wheezing or hemoptysis.  CARDIOVASCULAR: No chest pain, orthopnea, edema.  GASTROINTESTINAL: No nausea, vomiting, diarrhea or abdominal pain.  GENITOURINARY: No dysuria, hematuria. Has a Foley in place ENDOCRINE: No polyuria, nocturia,  HEMATOLOGY: No anemia, easy bruising or bleeding SKIN: No rash or lesion. MUSCULOSKELETAL: No joint pain or arthritis.   NEUROLOGIC: No tingling, numbness, weakness.  PSYCHIATRY: No anxiety or depression.   MEDICATIONS AT HOME:  Prior to Admission medications   Medication Sig Start Date End Date Taking? Authorizing Provider  aspirin EC 81 MG tablet Take 81 mg by mouth daily.   Yes Historical Provider, MD  cephALEXin (KEFLEX) 500 MG capsule Take 1 capsule (500 mg total) by mouth every 8 (eight) hours. 07/03/15  Yes Katharina Caper, MD  cholestyramine light (PREVALITE) 4 g packet Take 1 packet (4 g total) by mouth daily. 07/03/15  Yes Katharina Caper, MD  feeding supplement, ENSURE ENLIVE, (ENSURE ENLIVE) LIQD Take 237  mLs by mouth 3 (three) times daily between meals. 07/03/15  Yes Katharina Caper, MD  loperamide (IMODIUM) 2 MG capsule Take 1 capsule (2 mg total) by mouth every 4 (four) hours as needed for diarrhea or loose stools. 07/03/15  Yes Katharina Caper, MD  losartan (COZAAR) 100 MG tablet Take 100 mg by mouth daily.   Yes Historical Provider, MD  metoprolol succinate (TOPROL-XL) 50 MG 24 hr tablet Take 50 mg by mouth daily.    Yes Historical Provider, MD  Multiple Vitamin (MULTIVITAMIN WITH MINERALS) TABS tablet Take 1  tablet by mouth daily.   Yes Historical Provider, MD  simvastatin (ZOCOR) 40 MG tablet Take 40 mg by mouth at bedtime.   Yes Historical Provider, MD  tamsulosin (FLOMAX) 0.4 MG CAPS capsule Take 0.4 mg by mouth daily.   Yes Historical Provider, MD      PHYSICAL EXAMINATION:   VITAL SIGNS: Blood pressure 140/87, pulse 87, temperature 99.7 F (37.6 C), temperature source Oral, resp. rate 20, height  (1.905 m), weight 99.791 kg (220 lb), SpO2 95 %.  GENERAL:  70 y.o.-year-old patient lying in the bed with no acute distress.  EYES: Pupils equal, round, reactive to light and accommodation. No scleral icterus. Extraocular muscles intact.  HEENT: Head atraumatic, normocephalic. Oropharynx and nasopharynx clear.  NECK:  Supple, no jugular venous distention. No thyroid enlargement, no tenderness.  LUNGS: Normal breath sounds bilaterally, no wheezing, rales,rhonchi or crepitation. No use of accessory muscles of respiration.  CARDIOVASCULAR: S1, S2 normal. No murmurs, rubs, or gallops.  ABDOMEN: Soft, nontender, nondistended. Bowel sounds present. No organomegaly or mass.  EXTREMITIES: No pedal edema, cyanosis, or clubbing.  NEUROLOGIC: Cranial nerves II through XII are intact. Muscle strength 5/5 in all extremities. Sensation intact. Gait not checked.  PSYCHIATRIC: The patient is alert and oriented x 3.  SKIN: Rash on right side of his face with some blistering   LABORATORY PANEL:   CBC  Recent Labs Lab 06/29/15 0301 06/30/15 0401 07/01/15 0548 07/02/15 0708 07/03/15 0529 07/04/15 1615  WBC 4.7 22.2* 26.4* 24.0* 20.6* 19.5*  HGB 16.4 14.9 13.8 13.6 13.9 14.8  HCT 48.8 47.3 42.8 41.2 41.6 44.9  PLT 220 79* 75* 86* 76* 92*  MCV 88.6 88.8 87.6 87.7 86.4 85.5  MCH 29.7 28.0 28.3 28.9 29.0 28.3  MCHC 33.6 31.6* 32.3 33.0 33.5 33.1  RDW 14.4 15.3* 15.1* 15.0* 14.7* 14.8*  LYMPHSABS 0.7*  --   --   --   --  3.5  MONOABS 0.0*  --   --   --   --  1.4*  EOSABS 0.1  --   --   --   --   0.0  BASOSABS 0.0  --   --   --   --  0.0   ------------------------------------------------------------------------------------------------------------------  Chemistries   Recent Labs Lab 06/29/15 0301 06/30/15 0401 07/01/15 0548 07/02/15 0708 07/03/15 0529 07/04/15 1615  NA 138 142 142 140 142 137  K 4.5 5.2* 3.9 3.8 3.9 3.7  CL 104 110 115* 116* 117* 111  CO2 24 19* 17* 19* 19* 22  GLUCOSE 103* 84 78 92 106* 109*  BUN 31* 44* 41* 29* 27* 23*  CREATININE 1.73* 2.95* 1.71* 1.10 1.15 1.11  CALCIUM 9.2 7.8* 7.6* 7.7* 8.3* 8.2*  AST 24  --   --   --   --  52*  ALT 21  --   --   --   --  49  ALKPHOS 70  --   --   --   --  166*  BILITOT 1.0  --   --   --   --  0.9   ------------------------------------------------------------------------------------------------------------------ estimated creatinine clearance is 73 mL/min (by C-G formula based on Cr of 1.11). ------------------------------------------------------------------------------------------------------------------ No results for input(s): TSH, T4TOTAL, T3FREE, THYROIDAB in the last 72 hours.  Invalid input(s): FREET3   Coagulation profile  Recent Labs Lab 06/29/15 0301  INR 1.11   ------------------------------------------------------------------------------------------------------------------- No results for input(s): DDIMER in the last 72 hours. -------------------------------------------------------------------------------------------------------------------  Cardiac Enzymes  Recent Labs Lab 06/29/15 0301 07/04/15 1615  TROPONINI 0.04* 0.57*   ------------------------------------------------------------------------------------------------------------------ Invalid input(s): POCBNP  ---------------------------------------------------------------------------------------------------------------  Urinalysis    Component Value Date/Time   COLORURINE AMBER* 06/29/2015 0329   APPEARANCEUR CLOUDY*  06/29/2015 0329   LABSPEC 1.024 06/29/2015 0329   PHURINE 5.0 06/29/2015 0329   GLUCOSEU NEGATIVE 06/29/2015 0329   HGBUR 3+* 06/29/2015 0329   BILIRUBINUR NEGATIVE 06/29/2015 0329   KETONESUR TRACE* 06/29/2015 0329   PROTEINUR >500* 06/29/2015 0329   NITRITE NEGATIVE 06/29/2015 0329   LEUKOCYTESUR 3+* 06/29/2015 0329     RADIOLOGY: Dg Chest 2 View  07/04/2015  CLINICAL DATA:  Sepsis.  Shortness of breath. EXAM: CHEST  2 VIEW COMPARISON:  06/29/2015 FINDINGS: Bibasilar airspace opacities likely reflect atelectasis. Heart is normal size. Trace bilateral pleural effusions. No acute bony abnormality. IMPRESSION: Trace bilateral effusions with bibasilar atelectasis. Electronically Signed   By: Charlett Nose M.D.   On: 07/04/2015 16:34    EKG: Orders placed or performed during the hospital encounter of 07/04/15  . ED EKG  . ED EKG  . EKG 12-Lead  . EKG 12-Lead    IMPRESSION AND PLAN: Patient is a 70 year old African-American male who was just discharged yesterday as being brought back with nonspecific shortness of breath  1. Elevated troponin: Recent echo done in the hospital showed diffuse hypokinesis. Will place patient under eyedrops check serial cardiac enzymes cardiology evaluation for further recommendations  2. Herpes zoster involving his face will treat with acyclovir isolation  3. UTI with persistent elevated WBC count repeat urine cultures and urinalysis place patient back on ceftriaxone I'll hold Ceftin  4. Hypertension continue losartan and metoprolol  5. Urinary retention: Continue Flomax was recently seen by urology they recommended outpatient follow-up patient with his wife insist on having voiding trial while in the hospital. I will start him also on Hytrin consider voiding trial  6. Hyperlipidemia continue Zocor  7. Miscellaneous Lovenox for DVT prophylaxis      All the records are reviewed and case discussed with ED provider. Management plans discussed with  the patient, family and they are in agreement.  CODE STATUS:    Code Status Orders        Start     Ordered   07/04/15 1809  Full code   Continuous     07/04/15 1809    Code Status History    Date Active Date Inactive Code Status Order ID Comments User Context   06/29/2015  6:02 AM 07/03/2015  9:32 PM Full Code [id number]  Ihor Austin, MD ED       TOTAL TIME TAKING CARE OF THIS PATIENT: .    Auburn Bilberry M.D on 07/04/2015 at 6:22 PM  Between 7am to 6pm - Pager - [phone]  After 6pm go to [email or web address] - password [email or web address] [facility]  [facility]  Office  [phone]  CC: Primary care physician; Quita Skye, MD

## 2015-07-04 NOTE — ED Notes (Signed)
Critical troponin 0.57 Dr.Stafford notified, pt taken to room

## 2015-07-04 NOTE — ED Provider Notes (Signed)
[facility] Emergency Department Provider Note  ____________________________________________  Time seen: Approximately 5:10 PM  I have reviewed the triage vital signs and the nursing notes.   HISTORY  Chief Complaint Rash and Shortness of Breath    HPI Casey Murphy is a 70 y.o. male with history of HTN, CHF with EF 25-30%, BPH with foley who presents with shortness of breath worse with exertion, gradual onset, intermittent, currently mild, no other modifying factors. The patient was discharged from this hospital yesterday after being treated for Escherichia coli UTI sepsis, discharge with Keflex. He reports worsening shortness of breath since yesterday. No chest pain. No fevers. He also notes worsening rash on the right face. This was noted nearly 3 days ago. No vomiting or diarrhea, no fevers or chills.   Past Medical History  Diagnosis Date  . Hypertension   . BPH (benign prostatic hyperplasia)   . CHF (congestive heart failure) [facility])     Patient Active Problem List   Diagnosis Date Noted  . Acute renal failure (HCC) 07/03/2015  . Urinary retention 07/03/2015  . BPH (benign prostatic hyperplasia) 07/03/2015  . Foley catheter in place 07/03/2015  . Chronic combined systolic and diastolic CHF (congestive heart failure) (HCC) 07/03/2015  . Diarrhea 07/03/2015  . Sepsis (HCC) 06/29/2015  . Acute pyelonephritis 06/29/2015  . Hypotension 06/29/2015    Past Surgical History  Procedure Laterality Date  . None      Current Outpatient Rx  Name  Route  Sig  Dispense  Refill  . aspirin EC 81 MG tablet   Oral   Take 81 mg by mouth daily.         . cephALEXin (KEFLEX) 500 MG capsule   Oral   Take 1 capsule (500 mg total) by mouth every 8 (eight) hours.   36 capsule   0   . cholestyramine light (PREVALITE) 4 g packet   Oral   Take 1 packet (4 g total) by mouth daily.   20 packet   0   . feeding supplement, ENSURE ENLIVE, (ENSURE ENLIVE)  LIQD   Oral   Take 237 mLs by mouth 3 (three) times daily between meals.   237 mL   12   . loperamide (IMODIUM) 2 MG capsule   Oral   Take 1 capsule (2 mg total) by mouth every 4 (four) hours as needed for diarrhea or loose stools.   30 capsule   0   . losartan (COZAAR) 100 MG tablet   Oral   Take 100 mg by mouth daily.         . metoprolol succinate (TOPROL-XL) 50 MG 24 hr tablet   Oral   Take 50 mg by mouth daily.          . Multiple Vitamin (MULTIVITAMIN WITH MINERALS) TABS tablet   Oral   Take 1 tablet by mouth daily.         . simvastatin (ZOCOR) 40 MG tablet   Oral   Take 40 mg by mouth at bedtime.         . tamsulosin (FLOMAX) 0.4 MG CAPS capsule   Oral   Take 0.4 mg by mouth daily.           Allergies Lisinopril and Sulfa antibiotics  No family history on file.  Social History Social History  Substance Use Topics  . Smoking status: Former Smoker    Types: Cigarettes    Quit date: 05/29/2015  . Smokeless tobacco: Never  Used  . Alcohol Use: 0.0 oz/week    0 Standard drinks or equivalent per week     Comment: wine occasionally    Review of Systems Constitutional: No fever/chills Eyes: No visual changes. ENT: No sore throat. Cardiovascular: Denies chest pain. Respiratory: + shortness of breath. Gastrointestinal: No abdominal pain.  No nausea, no vomiting.  No diarrhea.  No constipation. Genitourinary: Negative for dysuria. Musculoskeletal: Negative for back pain. Skin: Negative for rash. Neurological: Negative for headaches, focal weakness or numbness.  10-point ROS otherwise negative.  ____________________________________________   PHYSICAL EXAM:  VITAL SIGNS: ED Triage Vitals  Enc Vitals Group     BP 07/04/15 1604 140/87 mmHg     Pulse Rate 07/04/15 1604 87     Resp 07/04/15 1604 20     Temp 07/04/15 1604 99.7 F (37.6 C)     Temp Source 07/04/15 1604 Oral     SpO2 07/04/15 1604 95 %     Weight 07/04/15 1604 220 lb  (99.791 kg)     Height 07/04/15 1604  (1.905 m)     Head Cir --      Peak Flow --      Pain Score 07/04/15 1605 0     Pain Loc --      Pain Edu? --      Excl. in GC? --     Constitutional: Alert and oriented. Nontoxic appearing and in no acute distress. Eyes: Conjunctivae are normal. PERRL. EOMI. Head: Atraumatic. Nose: No congestion/rhinnorhea. Mouth/Throat: Mucous membranes are moist.  Oropharynx non-erythematous. Neck: No stridor.  Cardiovascular: Normal rate, regular rhythm. Grossly normal heart sounds.  Good peripheral circulation. Respiratory: Normal respiratory effort.  No retractions. Lungs with slightly diminished breath sounds in bilateral bases. Gastrointestinal: Soft and nontender. No distention.  No CVA tenderness. Genitourinary: + foley Musculoskeletal: No lower extremity tenderness nor edema.  No joint effusions. No calf swelling or asymmetry. Neurologic:  Normal speech and language. No gross focal neurologic deficits are appreciated.  Skin:  Skin is warm, dry. Erythematous vesicular rash in the right cheek, right lower face, left lower face. Psychiatric: Mood and affect are normal. Speech and behavior are normal.  ____________________________________________   LABS (all labs ordered are listed, but only abnormal results are displayed)  Labs Reviewed  CBC WITH DIFFERENTIAL/PLATELET - Abnormal; Notable for the following:    WBC 19.5 (*)    RDW 14.8 (*)    Platelets 92 (*)    All other components within normal limits  TROPONIN I - Abnormal; Notable for the following:    Troponin I 0.57 (*)    All other components within normal limits  COMPREHENSIVE METABOLIC PANEL - Abnormal; Notable for the following:    Glucose, Bld 109 (*)    BUN 23 (*)    Calcium 8.2 (*)    Albumin 2.9 (*)    AST 52 (*)    Alkaline Phosphatase 166 (*)    Anion gap 4 (*)    All other components within normal limits   ____________________________________________  EKG  ED ECG  REPORT I, Gayla Doss, the attending physician, personally viewed and interpreted this ECG.   Date: 07/04/2015  EKG Time: 16:15  Rate: 84  Rhythm: normal sinus rhythm  Axis: normal  Intervals:none  ST&T Change: No acute ST elevation. Q-wave in V2. Flattened T-wave in V6.  ____________________________________________  RADIOLOGY  CXR  IMPRESSION: Trace bilateral effusions with bibasilar atelectasis.  ____________________________________________   PROCEDURES  Procedure(s) performed: None  Critical Care performed: No  ____________________________________________   INITIAL IMPRESSION / ASSESSMENT AND PLAN / ED COURSE  Pertinent labs & imaging results that were available during my care of the patient were reviewed by me and considered in my medical decision making (see chart for details).  Casey Murphy is a 70 y.o. male with history of HTN, CHF with EF 25-30%, BPH with foley who presents with shortness of breath worse with exertion, gradual onset, intermittent, currently mild, no other modifying factors. On exam, he is a nontoxic-appearing and in no acute distress. Vital signs are stable, he is afebrile. He is slightly diminished breath sounds bilateral bases. Labs reviewed and are notable for leukocytosis which is down trending from prior. CMP notable for mild AST and alkaline phosphatase elevation. Troponin is elevated 0.57. Question demand ischemia versus NSTEMI. No calf swelling or tenderness, no chest pain, no hypoxia or tachypnea. Chest x-ray shows trace bilateral effusions. Aspirin ordered. Case discussed with hospitalist, Dr. Allena Katz, for admission at this time. Additionally, I suspect he has shingles rash on the face. Valacyclovir ordered. ____________________________________________   FINAL CLINICAL IMPRESSION(S) / ED DIAGNOSES  Final diagnoses:  NSTEMI (non-ST elevated myocardial infarction) (HCC)  SOB (shortness of breath)  Shingles      Gayla Doss,  MD 07/04/15 [phone]

## 2015-07-04 NOTE — ED Notes (Signed)
Attempted to call report and RN was busy. Floor reports unable to take report.

## 2015-07-04 NOTE — ED Notes (Addendum)
Pt was discharged from hospital yesterday with dx of sepsis.  Today pt has a rash on right side of face.  No itching.  Pt reports sob.  No chest pain.  Pt has a urinary catheter and reports blood in urine.  Cath in place for 2 weeks and pt requesting it to be removed.   Pt alert.

## 2015-07-04 NOTE — ED Notes (Addendum)
4 attempts at an IV has had no success. No IV access at this time. MD aware at this time.

## 2015-07-05 ENCOUNTER — Observation Stay: Payer: Medicare HMO

## 2015-07-05 ENCOUNTER — Encounter: Payer: Self-pay | Admitting: Radiology

## 2015-07-05 LAB — BASIC METABOLIC PANEL WITH GFR
Anion gap: 7 (ref 5–15)
BUN: 25 mg/dL — ABNORMAL HIGH (ref 6–20)
CO2: 24 mmol/L (ref 22–32)
Calcium: 8 mg/dL — ABNORMAL LOW (ref 8.9–10.3)
Chloride: 109 mmol/L (ref 101–111)
Creatinine, Ser: 1.25 mg/dL — ABNORMAL HIGH (ref 0.61–1.24)
GFR calc Af Amer: >60 mL/min
GFR calc non Af Amer: 56 mL/min — ABNORMAL LOW
Glucose, Bld: 93 mg/dL (ref 65–99)
Potassium: 3.6 mmol/L (ref 3.5–5.1)
Sodium: 140 mmol/L (ref 135–145)

## 2015-07-05 LAB — CBC
HCT: 43.2 % (ref 40.0–52.0)
Hemoglobin: 14.4 g/dL (ref 13.0–18.0)
MCH: 28.9 pg (ref 26.0–34.0)
MCHC: 33.4 g/dL (ref 32.0–36.0)
MCV: 86.6 fL (ref 80.0–100.0)
Platelets: 99 K/uL — ABNORMAL LOW (ref 150–440)
RBC: 4.99 MIL/uL (ref 4.40–5.90)
RDW: 15.1 % — ABNORMAL HIGH (ref 11.5–14.5)
WBC: 15.7 K/uL — ABNORMAL HIGH (ref 3.8–10.6)

## 2015-07-05 LAB — TROPONIN I
TROPONIN I: 0.54 ng/mL — AB (ref <0.031)
Troponin I: 0.39 ng/mL — ABNORMAL HIGH (ref <0.031)

## 2015-07-05 MED ORDER — TECHNETIUM TO 99M ALBUMIN AGGREGATED
4.2130 | Freq: Once | INTRAVENOUS | Status: AC | PRN
  Administered 2015-07-05: 4.215 via INTRAVENOUS

## 2015-07-05 MED ORDER — VANCOMYCIN HCL IN DEXTROSE 1-5 GM/200ML-% IV SOLN
1000.0000 mg | Freq: Once | INTRAVENOUS | Status: DC
  Filled 2015-07-05: qty 200

## 2015-07-05 MED ORDER — ENSURE ENLIVE PO LIQD
237.0000 mL | Freq: Three times a day (TID) | ORAL | Status: DC
  Administered 2015-07-05 – 2015-07-07 (×9): 237 mL via ORAL

## 2015-07-05 MED ORDER — BOOST PLUS PO LIQD
237.0000 mL | Freq: Three times a day (TID) | ORAL | Status: DC
  Filled 2015-07-05: qty 237

## 2015-07-05 MED ORDER — VANCOMYCIN HCL IN DEXTROSE 1-5 GM/200ML-% IV SOLN
1000.0000 mg | Freq: Once | INTRAVENOUS | Status: AC
  Administered 2015-07-05: 1000 mg via INTRAVENOUS
  Filled 2015-07-05: qty 200

## 2015-07-05 MED ORDER — VANCOMYCIN HCL IN DEXTROSE 1-5 GM/200ML-% IV SOLN
1000.0000 mg | Freq: Two times a day (BID) | INTRAVENOUS | Status: DC
  Administered 2015-07-06 – 2015-07-07 (×3): 1000 mg via INTRAVENOUS
  Filled 2015-07-05 (×5): qty 200

## 2015-07-05 MED ORDER — TECHNETIUM TC 99M DIETHYLENETRIAME-PENTAACETIC ACID
31.6110 | Freq: Once | INTRAVENOUS | Status: AC | PRN
  Administered 2015-07-05: 31.611 via INTRAVENOUS

## 2015-07-05 MED ORDER — PANTOPRAZOLE SODIUM 40 MG PO TBEC
40.0000 mg | DELAYED_RELEASE_TABLET | Freq: Every day | ORAL | Status: DC
  Administered 2015-07-05 – 2015-07-07 (×3): 40 mg via ORAL
  Filled 2015-07-05 (×3): qty 1

## 2015-07-05 MED ORDER — VANCOMYCIN HCL IN DEXTROSE 1-5 GM/200ML-% IV SOLN: 1000.0000 mg | Freq: Two times a day (BID) | INTRAVENOUS | Status: DC

## 2015-07-05 NOTE — Progress Notes (Signed)
ANTIBIOTIC CONSULT NOTE - INITIAL  Pharmacy Consult for Vancomycin Indication: cellulitis  Allergies  Allergen Reactions  . Lisinopril Swelling  . Sulfa Antibiotics Hives and Swelling    Patient Measurements: Height:  (190.5 cm) Weight: 213 lb 12.8 oz (96.979 kg) IBW/kg (Calculated) : 84.5 Adjusted Body Weight: 89.5 (patient is not 25% over IBW)  Vital Signs: Temp: 99.1 F (37.3 C) (01/25 0512) Temp Source: Oral (01/25 0512) BP: 124/73 mmHg (01/25 0919) Pulse Rate: 66 (01/25 0919) Intake/Output from previous day: 01/24 0701 - 01/25 0700 In: -  Out: 1050 [Urine:1050] Intake/Output from this shift:    Labs:  Recent Labs  07/03/15 0529 07/04/15 1615 07/05/15 0441  WBC 20.6* 19.5* 15.7*  HGB 13.9 14.8 14.4  PLT 76* 92* 99*  CREATININE 1.15 1.11 1.25*   Estimated Creatinine Clearance: 64.8 mL/min (by C-G formula based on Cr of 1.25). No results for input(s): VANCOTROUGH, VANCOPEAK, VANCORANDOM, GENTTROUGH, GENTPEAK, GENTRANDOM, TOBRATROUGH, TOBRAPEAK, TOBRARND, AMIKACINPEAK, AMIKACINTROU, AMIKACIN in the last 72 hours.   Microbiology: Recent Results (from the past 720 hour(s))  Culture, blood (routine x 2)     Status: None   Collection Time: 06/29/15  3:00 AM  Result Value Ref Range Status   Specimen Description BLOOD RIGHT ANTECUBITAL  Final   Special Requests BOTTLES DRAWN AEROBIC AND ANAEROBIC  Final   Culture  Setup Time   Final    GRAM NEGATIVE RODS IN BOTH AEROBIC AND ANAEROBIC BOTTLES CRITICAL RESULT CALLED TO, READ BACK BY AND VERIFIED WITH: KAREN HAYES AT 1437 ON 06/29/15 DLV    Culture   Final    ESCHERICHIA COLI IN BOTH AEROBIC AND ANAEROBIC BOTTLES    Report Status 07/01/2015 FINAL  Final   Organism ID, Bacteria ESCHERICHIA COLI  Final      Susceptibility   Escherichia coli - MIC*    AMPICILLIN <=2 SENSITIVE Sensitive     CEFTAZIDIME <=1 SENSITIVE Sensitive     CEFAZOLIN <=4 SENSITIVE Sensitive     CEFTRIAXONE <=1 SENSITIVE Sensitive      CIPROFLOXACIN <=0.25 SENSITIVE Sensitive     GENTAMICIN <=1 SENSITIVE Sensitive     IMIPENEM <=0.25 SENSITIVE Sensitive     TRIMETH/SULFA <=20 SENSITIVE Sensitive     PIP/TAZO Value in next row Sensitive      SENSITIVE<=4    CEFEPIME Value in next row Sensitive      SENSITIVE<=1    AMPICILLIN/SULBACTAM Value in next row Sensitive      SENSITIVE<=2    Extended ESBL Value in next row Sensitive      SENSITIVE<=2    * ESCHERICHIA COLI  Blood Culture ID Panel (Reflexed)     Status: Abnormal   Collection Time: 06/29/15  3:00 AM  Result Value Ref Range Status   Enterococcus species NOT DETECTED NOT DETECTED Final   Listeria monocytogenes NOT DETECTED NOT DETECTED Final   Staphylococcus species NOT DETECTED NOT DETECTED Final   Staphylococcus aureus NOT DETECTED NOT DETECTED Final   Streptococcus species NOT DETECTED NOT DETECTED Final   Streptococcus agalactiae NOT DETECTED NOT DETECTED Final   Streptococcus pneumoniae NOT DETECTED NOT DETECTED Final   Streptococcus pyogenes NOT DETECTED NOT DETECTED Final   Acinetobacter baumannii NOT DETECTED NOT DETECTED Final   Enterobacteriaceae species DETECTED (A) NOT DETECTED Final    Comment: CRITICAL RESULT CALLED TO, READ BACK BY AND VERIFIED WITH: KAREN HAYES FOR E. COLI AND ENTEROBACTERIACEAE AT 1437 ON 06/29/15. DLV    Enterobacter cloacae complex NOT DETECTED NOT DETECTED  Final   Escherichia coli DETECTED (A) NOT DETECTED Final   Klebsiella oxytoca NOT DETECTED NOT DETECTED Final   Klebsiella pneumoniae NOT DETECTED NOT DETECTED Final   Proteus species NOT DETECTED NOT DETECTED Final   Serratia marcescens NOT DETECTED NOT DETECTED Final   Haemophilus influenzae NOT DETECTED NOT DETECTED Final   Neisseria meningitidis NOT DETECTED NOT DETECTED Final   Pseudomonas aeruginosa NOT DETECTED NOT DETECTED Final   Candida albicans NOT DETECTED NOT DETECTED Final   Candida glabrata NOT DETECTED NOT DETECTED Final   Candida krusei NOT  DETECTED NOT DETECTED Final   Candida parapsilosis NOT DETECTED NOT DETECTED Final   Candida tropicalis NOT DETECTED NOT DETECTED Final   Carbapenem resistance NOT DETECTED NOT DETECTED Final   Methicillin resistance NOT DETECTED NOT DETECTED Final   Vancomycin resistance NOT DETECTED NOT DETECTED Final  Culture, blood (routine x 2)     Status: None   Collection Time: 06/29/15  3:01 AM  Result Value Ref Range Status   Specimen Description BLOOD LEFT ANTECUBITAL  Final   Special Requests BOTTLES DRAWN AEROBIC AND ANAEROBIC  Final   Culture  Setup Time   Final    GRAM NEGATIVE RODS IN BOTH AEROBIC AND ANAEROBIC BOTTLES CRITICAL VALUE NOTED.  VALUE IS CONSISTENT WITH PREVIOUSLY REPORTED AND CALLED VALUE.    Culture   Final    ESCHERICHIA COLI IN BOTH AEROBIC AND ANAEROBIC BOTTLES    Report Status 07/01/2015 FINAL  Final   Organism ID, Bacteria ESCHERICHIA COLI  Final      Susceptibility   Escherichia coli - MIC*    AMPICILLIN <=2 SENSITIVE Sensitive     CEFTAZIDIME <=1 SENSITIVE Sensitive     CEFAZOLIN <=4 SENSITIVE Sensitive     CEFTRIAXONE <=1 SENSITIVE Sensitive     CIPROFLOXACIN <=0.25 SENSITIVE Sensitive     GENTAMICIN <=1 SENSITIVE Sensitive     IMIPENEM <=0.25 SENSITIVE Sensitive     TRIMETH/SULFA <=20 SENSITIVE Sensitive     CEFEPIME Value in next row Sensitive      SENSITIVE<=1    PIP/TAZO Value in next row Sensitive      SENSITIVE<=4    Extended ESBL Value in next row Sensitive      SENSITIVE<=4    AMPICILLIN/SULBACTAM Value in next row Sensitive      SENSITIVE<=2    * ESCHERICHIA COLI  Urine culture     Status: None   Collection Time: 06/29/15  3:29 AM  Result Value Ref Range Status   Specimen Description URINE, RANDOM  Final   Special Requests NONE  Final   Culture >=100,000 COLONIES/mL ESCHERICHIA COLI  Final   Report Status 07/01/2015 FINAL  Final   Organism ID, Bacteria ESCHERICHIA COLI  Final      Susceptibility   Escherichia coli - MIC*     AMPICILLIN <=2 SENSITIVE Sensitive     CEFTAZIDIME <=1 SENSITIVE Sensitive     CEFAZOLIN <=4 SENSITIVE Sensitive     CEFTRIAXONE <=1 SENSITIVE Sensitive     CIPROFLOXACIN <=0.25 SENSITIVE Sensitive     GENTAMICIN <=1 SENSITIVE Sensitive     IMIPENEM <=0.25 SENSITIVE Sensitive     TRIMETH/SULFA <=20 SENSITIVE Sensitive     NITROFURANTOIN Value in next row Sensitive      SENSITIVE<=16    PIP/TAZO Value in next row Sensitive      SENSITIVE<=4    * >=100,000 COLONIES/mL ESCHERICHIA COLI  Culture, blood (x 2)     Status: None   Collection Time:  06/29/15  4:46 PM  Result Value Ref Range Status   Specimen Description BLOOD LEFT HAND  Final   Special Requests BOTTLES DRAWN AEROBIC AND ANAEROBIC 1CC  Final   Culture NO GROWTH 5 DAYS  Final   Report Status 07/04/2015 FINAL  Final  Culture, blood (x 2)     Status: None   Collection Time: 06/29/15  5:03 PM  Result Value Ref Range Status   Specimen Description BLOOD RIGHT HAND  Final   Special Requests   Final    BOTTLES DRAWN AEROBIC AND ANAEROBIC  AERO 2CC ANA 1CC   Culture NO GROWTH 5 DAYS  Final   Report Status 07/04/2015 FINAL  Final  C difficile quick scan w PCR reflex     Status: None   Collection Time: 06/30/15  1:20 AM  Result Value Ref Range Status   C Diff antigen NEGATIVE NEGATIVE Final   C Diff toxin NEGATIVE NEGATIVE Final   C Diff interpretation Negative for C. difficile  Final  Urine culture     Status: None (Preliminary result)   Collection Time: 07/04/15  8:24 PM  Result Value Ref Range Status   Specimen Description URINE, RANDOM  Final   Special Requests NONE  Final   Culture NO GROWTH < 24 HOURS  Final   Report Status PENDING  Incomplete    Medical History: Past Medical History  Diagnosis Date  . Hypertension   . BPH (benign prostatic hyperplasia)   . CHF (congestive heart failure) (HCC)   . MRSA bacteremia   . CAD (coronary artery disease)   . MI, old     Medications:  Scheduled:  . aspirin EC  325 mg  Oral Daily  . cefTRIAXone (ROCEPHIN)  IV  1 g Intravenous Q24H  . cholestyramine light  4 g Oral Daily  . enoxaparin (LOVENOX) injection  40 mg Subcutaneous Q24H  . feeding supplement (ENSURE ENLIVE)  237 mL Oral TID WC & HS  . losartan  100 mg Oral Daily  . metoprolol succinate  50 mg Oral Daily  . multivitamin with minerals  1 tablet Oral Daily  . pantoprazole  40 mg Oral Daily  . simvastatin  40 mg Oral QHS  . sodium chloride flush  3 mL Intravenous Q12H  . sodium chloride flush  3 mL Intravenous Q12H  . tamsulosin  0.4 mg Oral Daily  . terazosin  2 mg Oral QHS  . valACYclovir  1,000 mg Oral TID  . vancomycin  1,000 mg Intravenous Once  . vancomycin  1,000 mg Intravenous Once  . [START ON 07/06/2015] vancomycin  1,000 mg Intravenous Q12H   Assessment: Pharmacy consulted to dose Vancomycin in a 70 yo male with cellulitis.  Per notes, patient with rash on face.  Patient also ordered Ceftriaxone 1 gm IV q24h for UTI and oral valacyclovir 1000 mg TID for treatment of shingles.   SCr: 1.25, est CrCl~64.8 mL/min, ke: 0.058, t1/2: 11.95 h, VD: 67.9 kg  UCx: NGTD, WCx: pending  Goal of Therapy:  Vancomycin trough level 10-15 mcg/ml  Plan:  Will order Vancomycin 1 gm IV once with a stacked dose of Vancomycin 1 gm IV ~8 hours later.  Will then start patient on Vancomycin 1 gm IV q12h for goal trough of 10-15 mcg/ml.  Will order trough prior to 5th dose on 1/27 at 2130.  Pharmacy will continue to follow.   Maela Takeda G 07/05/2015,1:40 PM

## 2015-07-05 NOTE — Consult Note (Signed)
[facility] Infectious Disease     Reason for Consult:UTI, shingles    Referring Physician: Seth Bake Date of Admission:  07/04/2015   Active Problems:   SOB (shortness of breath)  HPI: Casey Murphy is a 70 y.o. male with hx BPH followed by urology at [facility] with placement of chronic foley admitted 1/19-1/23 with UTI due to sensitive E coli. Was treated with ctx and dced on keflex. Readmitted 1/24 with SOB and noted also to have a rash on face with concern if it is cellulitis or Zoster.  Past Medical History  Diagnosis Date  . Hypertension   . BPH (benign prostatic hyperplasia)   . CHF (congestive heart failure) ([place])   . MRSA bacteremia   . CAD (coronary artery disease)   . MI, old    Past Surgical History  Procedure Laterality Date  . None     Social History  Substance Use Topics  . Smoking status: Former Smoker    Types: Cigarettes    Quit date: 05/29/2015  . Smokeless tobacco: Never Used  . Alcohol Use: 0.6 oz/week    1 Standard drinks or equivalent per week     Comment: wine occasionally   Family History  Problem Relation Age of Onset  . Hypertension      Allergies:  Allergies  Allergen Reactions  . Lisinopril Swelling  . Sulfa Antibiotics Hives and Swelling    Current antibiotics: Antibiotics Given (last 72 hours)    Date/Time Action Medication Dose   07/04/15 1906 Given   valACYclovir (VALTREX) tablet 1,000 mg 1,000 mg   07/04/15 2126 Given   valACYclovir (VALTREX) tablet 1,000 mg 1,000 mg   07/05/15 3832 Given   valACYclovir (VALTREX) tablet 1,000 mg 1,000 mg      MEDICATIONS: . aspirin EC  325 mg Oral Daily  . cefTRIAXone (ROCEPHIN)  IV  1 g Intravenous Q24H  . cholestyramine light  4 g Oral Daily  . feeding supplement (ENSURE ENLIVE)  237 mL Oral TID WC & HS  . losartan  100 mg Oral Daily  . metoprolol succinate  50 mg Oral Daily  . multivitamin with minerals  1 tablet Oral Daily  . pantoprazole  40 mg Oral Daily  . simvastatin  40 mg  Oral QHS  . sodium chloride flush  3 mL Intravenous Q12H  . sodium chloride flush  3 mL Intravenous Q12H  . tamsulosin  0.4 mg Oral Daily  . terazosin  2 mg Oral QHS  . valACYclovir  1,000 mg Oral TID  . vancomycin  1,000 mg Intravenous Once  . vancomycin  1,000 mg Intravenous Once  . [START ON 07/06/2015] vancomycin  1,000 mg Intravenous Q12H    Review of Systems - 11 systems reviewed and negative per HPI   OBJECTIVE: Temp:  [99.1 F (37.3 C)-99.7 F (37.6 C)] 99.1 F (37.3 C) (01/25 0512) Pulse Rate:  [66-87] 66 (01/25 0919) Resp:  [16-20] 16 (01/25 0512) BP: (121-140)/(71-87) 124/73 mmHg (01/25 0919) SpO2:  [95 %-99 %] 96 % (01/25 0512) Weight:  [96.979 kg (213 lb 12.8 oz)-99.791 kg (220 lb)] 96.979 kg (213 lb 12.8 oz) (01/24 2047) GENERAL: 70 y.o.-year-old patient lying in the bed with no acute distress.  EYES: Pupils equal, round, reactive to light and accommodation. Right eye conjunctival injection was noted with some purulent discharge. Rash was noted, scaling on the face on the right side cheek and the left side of chin , no vesiclesnoted. No scleral icterus. Extraocular muscles intact.  HEENT: Head atraumatic, normocephalic. Oropharynx and nasopharynx clear.  NECK: Supple, no jugular venous distention. No thyroid enlargement, no tenderness.  LUNGS: Normal breath sounds bilaterally, no wheezing, rales,rhonchi or crepitation. No use of accessory muscles of respiration.  CARDIOVASCULAR: S1, S2 normal. No murmurs, rubs, or gallops.  ABDOMEN: Soft, nontender, nondistended. Bowel sounds present. No organomegaly or mass.  EXTREMITIES: No pedal edema, cyanosis, or clubbing.  NEUROLOGIC: Cranial nerves II through XII are intact. Muscle strength 5/5 in all extremities. Sensation intact. Gait not checked.  PSYCHIATRIC: The patient is alert and oriented x 3.  SKIN: facial rash as above  GU Foley catheter is present and drains clear urine  LABS: Results for orders  placed or performed during the hospital encounter of 07/04/15 (from the past 48 hour(s))  CBC with Differential     Status: Abnormal   Collection Time: 07/04/15  4:15 PM  Result Value Ref Range   WBC 19.5 (H) 3.8 - 10.6 K/uL   RBC 5.25 4.40 - 5.90 MIL/uL   Hemoglobin 14.8 13.0 - 18.0 g/dL   HCT 44.9 40.0 - 52.0 %   MCV 85.5 80.0 - 100.0 fL   MCH 28.3 26.0 - 34.0 pg   MCHC 33.1 32.0 - 36.0 g/dL   RDW 14.8 (H) 11.5 - 14.5 %   Platelets 92 (L) 150 - 440 K/uL   Neutrophils Relative % 67 %   Lymphocytes Relative 18 %   Monocytes Relative 7 %   Eosinophils Relative 0 %   Basophils Relative 0 %   Band Neutrophils 6 %   Metamyelocytes Relative 2 %   Myelocytes 0 %   Promyelocytes Absolute 0 %   Blasts 0 %   nRBC 0 0 /100 WBC   Other 0 %   Neutro Abs 14.6 (H) 1.4 - 6.5 K/uL   Lymphs Abs 3.5 1.0 - 3.6 K/uL   Monocytes Absolute 1.4 (H) 0.2 - 1.0 K/uL   Eosinophils Absolute 0.0 0 - 0.7 K/uL   Basophils Absolute 0.0 0 - 0.1 K/uL   WBC Morphology ATYPICAL LYMPHOCYTES   Troponin I     Status: Abnormal   Collection Time: 07/04/15  4:15 PM  Result Value Ref Range   Troponin I 0.57 (H) <0.031 ng/mL    Comment: READ BACK AND VERIFIED WITH JANE RYAN AT 1702 07/04/15 MLZ        POSSIBLE MYOCARDIAL ISCHEMIA. SERIAL TESTING RECOMMENDED.   Comprehensive metabolic panel     Status: Abnormal   Collection Time: 07/04/15  4:15 PM  Result Value Ref Range   Sodium 137 135 - 145 mmol/L   Potassium 3.7 3.5 - 5.1 mmol/L   Chloride 111 101 - 111 mmol/L   CO2 22 22 - 32 mmol/L   Glucose, Bld 109 (H) 65 - 99 mg/dL   BUN 23 (H) 6 - 20 mg/dL   Creatinine, Ser 1.11 0.61 - 1.24 mg/dL   Calcium 8.2 (L) 8.9 - 10.3 mg/dL   Total Protein 6.9 6.5 - 8.1 g/dL   Albumin 2.9 (L) 3.5 - 5.0 g/dL   AST 52 (H) 15 - 41 U/L   ALT 49 17 - 63 U/L   Alkaline Phosphatase 166 (H) 38 - 126 U/L   Total Bilirubin 0.9 0.3 - 1.2 mg/dL   GFR calc non Af Amer >60 >60 mL/min   GFR calc Af Amer >60 >60 mL/min    Comment:  (NOTE) The eGFR has been calculated using the CKD EPI equation. This calculation has  not been validated in all clinical situations. eGFR's persistently <60 mL/min signify possible Chronic Kidney Disease.    Anion gap 4 (L) 5 - 15  Urine culture     Status: None (Preliminary result)   Collection Time: 07/04/15  8:24 PM  Result Value Ref Range   Specimen Description URINE, RANDOM    Special Requests NONE    Culture NO GROWTH < 24 HOURS    Report Status PENDING   Urinalysis complete, with microscopic ([facility] only)     Status: Abnormal   Collection Time: 07/04/15  8:24 PM  Result Value Ref Range   Color, Urine YELLOW (A) YELLOW   APPearance CLOUDY (A) CLEAR   Glucose, UA NEGATIVE NEGATIVE mg/dL   Bilirubin Urine NEGATIVE NEGATIVE   Ketones, ur NEGATIVE NEGATIVE mg/dL   Specific Gravity, Urine 1.017 1.005 - 1.030   Hgb urine dipstick 2+ (A) NEGATIVE   pH 5.0 5.0 - 8.0   Protein, ur 100 (A) NEGATIVE mg/dL   Nitrite NEGATIVE NEGATIVE   Leukocytes, UA NEGATIVE NEGATIVE   RBC / HPF TOO NUMEROUS TO COUNT 0 - 5 RBC/hpf   WBC, UA 6-30 0 - 5 WBC/hpf   Bacteria, UA NONE SEEN NONE SEEN   Squamous Epithelial / LPF NONE SEEN NONE SEEN   Mucous PRESENT    Budding Yeast PRESENT   Troponin I     Status: Abnormal   Collection Time: 07/04/15  9:01 PM  Result Value Ref Range   Troponin I 0.55 (H) <0.031 ng/mL    Comment: PREVIOUS RESULT CALLED AT 1702 07/04/2015 BY MLZ        POSSIBLE MYOCARDIAL ISCHEMIA. SERIAL TESTING RECOMMENDED.   Troponin I     Status: Abnormal   Collection Time: 07/05/15  4:41 AM  Result Value Ref Range   Troponin I 0.54 (H) <0.031 ng/mL    Comment: PREVIOUS RESULT CALLED AT 1702 07/04/15.PMH        POSSIBLE MYOCARDIAL ISCHEMIA. SERIAL TESTING RECOMMENDED.   CBC     Status: Abnormal   Collection Time: 07/05/15  4:41 AM  Result Value Ref Range   WBC 15.7 (H) 3.8 - 10.6 K/uL   RBC 4.99 4.40 - 5.90 MIL/uL   Hemoglobin 14.4 13.0 - 18.0 g/dL   HCT 43.2 40.0 - 52.0  %   MCV 86.6 80.0 - 100.0 fL   MCH 28.9 26.0 - 34.0 pg   MCHC 33.4 32.0 - 36.0 g/dL   RDW 15.1 (H) 11.5 - 14.5 %   Platelets 99 (L) 150 - 440 K/uL  Basic metabolic panel     Status: Abnormal   Collection Time: 07/05/15  4:41 AM  Result Value Ref Range   Sodium 140 135 - 145 mmol/L   Potassium 3.6 3.5 - 5.1 mmol/L   Chloride 109 101 - 111 mmol/L   CO2 24 22 - 32 mmol/L   Glucose, Bld 93 65 - 99 mg/dL   BUN 25 (H) 6 - 20 mg/dL   Creatinine, Ser 1.25 (H) 0.61 - 1.24 mg/dL   Calcium 8.0 (L) 8.9 - 10.3 mg/dL   GFR calc non Af Amer 56 (L) >60 mL/min   GFR calc Af Amer >60 >60 mL/min    Comment: (NOTE) The eGFR has been calculated using the CKD EPI equation. This calculation has not been validated in all clinical situations. eGFR's persistently <60 mL/min signify possible Chronic Kidney Disease.    Anion gap 7 5 - 15  Troponin I  Status: Abnormal   Collection Time: 07/05/15 10:22 AM  Result Value Ref Range   Troponin I 0.39 (H) <0.031 ng/mL    Comment: PREVIOUS RESULT CALLED @1702  ON 07/04/15 BY PMH.Marland KitchenHKP        PERSISTENTLY INCREASED TROPONIN VALUES IN THE RANGE OF 0.04-0.49 ng/mL CAN BE SEEN IN:       -UNSTABLE ANGINA       -CONGESTIVE HEART FAILURE       -MYOCARDITIS       -CHEST TRAUMA       -ARRYHTHMIAS       -LATE PRESENTING MYOCARDIAL INFARCTION       -COPD   CLINICAL FOLLOW-UP RECOMMENDED.    No components found for: ESR, C REACTIVE PROTEIN MICRO: Recent Results (from the past 720 hour(s))  Culture, blood (routine x 2)     Status: None   Collection Time: 06/29/15  3:00 AM  Result Value Ref Range Status   Specimen Description BLOOD RIGHT ANTECUBITAL  Final   Special Requests BOTTLES DRAWN AEROBIC AND ANAEROBIC 5ML  Final   Culture  Setup Time   Final    GRAM NEGATIVE RODS IN BOTH AEROBIC AND ANAEROBIC BOTTLES CRITICAL RESULT CALLED TO, READ BACK BY AND VERIFIED WITH: KAREN HAYES AT 2706 ON 06/29/15 DLV    Culture   Final    ESCHERICHIA COLI IN BOTH AEROBIC  AND ANAEROBIC BOTTLES    Report Status 07/01/2015 FINAL  Final   Organism ID, Bacteria ESCHERICHIA COLI  Final      Susceptibility   Escherichia coli - MIC*    AMPICILLIN <=2 SENSITIVE Sensitive     CEFTAZIDIME <=1 SENSITIVE Sensitive     CEFAZOLIN <=4 SENSITIVE Sensitive     CEFTRIAXONE <=1 SENSITIVE Sensitive     CIPROFLOXACIN <=0.25 SENSITIVE Sensitive     GENTAMICIN <=1 SENSITIVE Sensitive     IMIPENEM <=0.25 SENSITIVE Sensitive     TRIMETH/SULFA <=20 SENSITIVE Sensitive     PIP/TAZO Value in next row Sensitive      SENSITIVE<=4    CEFEPIME Value in next row Sensitive      SENSITIVE<=1    AMPICILLIN/SULBACTAM Value in next row Sensitive      SENSITIVE<=2    Extended ESBL Value in next row Sensitive      SENSITIVE<=2    * ESCHERICHIA COLI  Blood Culture ID Panel (Reflexed)     Status: Abnormal   Collection Time: 06/29/15  3:00 AM  Result Value Ref Range Status   Enterococcus species NOT DETECTED NOT DETECTED Final   Listeria monocytogenes NOT DETECTED NOT DETECTED Final   Staphylococcus species NOT DETECTED NOT DETECTED Final   Staphylococcus aureus NOT DETECTED NOT DETECTED Final   Streptococcus species NOT DETECTED NOT DETECTED Final   Streptococcus agalactiae NOT DETECTED NOT DETECTED Final   Streptococcus pneumoniae NOT DETECTED NOT DETECTED Final   Streptococcus pyogenes NOT DETECTED NOT DETECTED Final   Acinetobacter baumannii NOT DETECTED NOT DETECTED Final   Enterobacteriaceae species DETECTED (A) NOT DETECTED Final    Comment: CRITICAL RESULT CALLED TO, READ BACK BY AND VERIFIED WITH: KAREN HAYES FOR E. COLI AND ENTEROBACTERIACEAE AT 1437 ON 06/29/15. DLV    Enterobacter cloacae complex NOT DETECTED NOT DETECTED Final   Escherichia coli DETECTED (A) NOT DETECTED Final   Klebsiella oxytoca NOT DETECTED NOT DETECTED Final   Klebsiella pneumoniae NOT DETECTED NOT DETECTED Final   Proteus species NOT DETECTED NOT DETECTED Final   Serratia marcescens NOT DETECTED NOT  DETECTED Final  Haemophilus influenzae NOT DETECTED NOT DETECTED Final   Neisseria meningitidis NOT DETECTED NOT DETECTED Final   Pseudomonas aeruginosa NOT DETECTED NOT DETECTED Final   Candida albicans NOT DETECTED NOT DETECTED Final   Candida glabrata NOT DETECTED NOT DETECTED Final   Candida krusei NOT DETECTED NOT DETECTED Final   Candida parapsilosis NOT DETECTED NOT DETECTED Final   Candida tropicalis NOT DETECTED NOT DETECTED Final   Carbapenem resistance NOT DETECTED NOT DETECTED Final   Methicillin resistance NOT DETECTED NOT DETECTED Final   Vancomycin resistance NOT DETECTED NOT DETECTED Final  Culture, blood (routine x 2)     Status: None   Collection Time: 06/29/15  3:01 AM  Result Value Ref Range Status   Specimen Description BLOOD LEFT ANTECUBITAL  Final   Special Requests BOTTLES DRAWN AEROBIC AND ANAEROBIC 5ML  Final   Culture  Setup Time   Final    GRAM NEGATIVE RODS IN BOTH AEROBIC AND ANAEROBIC BOTTLES CRITICAL VALUE NOTED.  VALUE IS CONSISTENT WITH PREVIOUSLY REPORTED AND CALLED VALUE.    Culture   Final    ESCHERICHIA COLI IN BOTH AEROBIC AND ANAEROBIC BOTTLES    Report Status 07/01/2015 FINAL  Final   Organism ID, Bacteria ESCHERICHIA COLI  Final      Susceptibility   Escherichia coli - MIC*    AMPICILLIN <=2 SENSITIVE Sensitive     CEFTAZIDIME <=1 SENSITIVE Sensitive     CEFAZOLIN <=4 SENSITIVE Sensitive     CEFTRIAXONE <=1 SENSITIVE Sensitive     CIPROFLOXACIN <=0.25 SENSITIVE Sensitive     GENTAMICIN <=1 SENSITIVE Sensitive     IMIPENEM <=0.25 SENSITIVE Sensitive     TRIMETH/SULFA <=20 SENSITIVE Sensitive     CEFEPIME Value in next row Sensitive      SENSITIVE<=1    PIP/TAZO Value in next row Sensitive      SENSITIVE<=4    Extended ESBL Value in next row Sensitive      SENSITIVE<=4    AMPICILLIN/SULBACTAM Value in next row Sensitive      SENSITIVE<=2    * ESCHERICHIA COLI  Urine culture     Status: None   Collection Time: 06/29/15  3:29 AM   Result Value Ref Range Status   Specimen Description URINE, RANDOM  Final   Special Requests NONE  Final   Culture >=100,000 COLONIES/mL ESCHERICHIA COLI  Final   Report Status 07/01/2015 FINAL  Final   Organism ID, Bacteria ESCHERICHIA COLI  Final      Susceptibility   Escherichia coli - MIC*    AMPICILLIN <=2 SENSITIVE Sensitive     CEFTAZIDIME <=1 SENSITIVE Sensitive     CEFAZOLIN <=4 SENSITIVE Sensitive     CEFTRIAXONE <=1 SENSITIVE Sensitive     CIPROFLOXACIN <=0.25 SENSITIVE Sensitive     GENTAMICIN <=1 SENSITIVE Sensitive     IMIPENEM <=0.25 SENSITIVE Sensitive     TRIMETH/SULFA <=20 SENSITIVE Sensitive     NITROFURANTOIN Value in next row Sensitive      SENSITIVE<=16    PIP/TAZO Value in next row Sensitive      SENSITIVE<=4    * >=100,000 COLONIES/mL ESCHERICHIA COLI  Culture, blood (x 2)     Status: None   Collection Time: 06/29/15  4:46 PM  Result Value Ref Range Status   Specimen Description BLOOD LEFT HAND  Final   Special Requests BOTTLES DRAWN AEROBIC AND ANAEROBIC [place]  Final   Culture NO GROWTH 5 DAYS  Final   Report Status 07/04/2015 FINAL  Final  Culture, blood (x 2)     Status: None   Collection Time: 06/29/15  5:03 PM  Result Value Ref Range Status   Specimen Description BLOOD RIGHT HAND  Final   Special Requests   Final    BOTTLES DRAWN AEROBIC AND ANAEROBIC  AERO 2CC ANA [place]   Culture NO GROWTH 5 DAYS  Final   Report Status 07/04/2015 FINAL  Final  C difficile quick scan w PCR reflex     Status: None   Collection Time: 06/30/15  1:20 AM  Result Value Ref Range Status   C Diff antigen NEGATIVE NEGATIVE Final   C Diff toxin NEGATIVE NEGATIVE Final   C Diff interpretation Negative for C. difficile  Final  Urine culture     Status: None (Preliminary result)   Collection Time: 07/04/15  8:24 PM  Result Value Ref Range Status   Specimen Description URINE, RANDOM  Final   Special Requests NONE  Final   Culture NO GROWTH < 24 HOURS  Final   Report Status  PENDING  Incomplete    IMAGING: Dg Chest 2 View  07/04/2015  CLINICAL DATA:  Sepsis.  Shortness of breath. EXAM: CHEST  2 VIEW COMPARISON:  06/29/2015 FINDINGS: Bibasilar airspace opacities likely reflect atelectasis. Heart is normal size. Trace bilateral pleural effusions. No acute bony abnormality. IMPRESSION: Trace bilateral effusions with bibasilar atelectasis. Electronically Signed   By: Rolm Baptise M.D.   On: 07/04/2015 16:34   Dg Abd 1 View  07/05/2015  CLINICAL DATA:  Abdominal pain, bloating EXAM: ABDOMEN - 1 VIEW COMPARISON:  None. FINDINGS: There is normal small bowel gas pattern. Some colonic gas noted in right colon and transverse colon. Minimal colonic gas in descending colon. Mild degenerative changes lumbar spine. No free abdominal air. IMPRESSION: Normal small bowel gas pattern. No free abdominal air. Colonic gas in proximal colon. Small amount of colonic gas in distal colon. Electronically Signed   By: Lahoma Crocker M.D.   On: 07/05/2015 14:37   Dg Chest Port 1 View  06/29/2015  CLINICAL DATA:  Acute onset of shortness of breath. Initial encounter. EXAM: PORTABLE CHEST 1 VIEW COMPARISON:  Chest radiograph performed earlier today at 3:02 a.m. FINDINGS: The lungs are relatively well expanded. Vascular congestion is noted, with mild bibasilar atelectasis. No pleural effusion or pneumothorax is seen The cardiomediastinal silhouette is normal in size. No acute osseous abnormalities identified. IMPRESSION: Vascular congestion, with mild bibasilar atelectasis. Electronically Signed   By: Garald Balding M.D.   On: 06/29/2015 23:38   Dg Chest Port 1 View  06/29/2015  CLINICAL DATA:  Acute onset of fever, nausea and vomiting. Recent Foley catheter placement. Initial encounter. EXAM: PORTABLE CHEST 1 VIEW COMPARISON:  None. FINDINGS: The lungs are well-aerated. Mild bibasilar atelectasis is noted. There is no evidence of pleural effusion or pneumothorax. The cardiomediastinal silhouette is within  normal limits. No acute osseous abnormalities are seen. IMPRESSION: Mild bibasilar atelectasis noted.  Lungs otherwise clear. Electronically Signed   By: Garald Balding M.D.   On: 06/29/2015 03:16   Ct Renal Stone Study  06/29/2015  CLINICAL DATA:  70 year old male with urinary retention and sepsis and hematuria. EXAM: CT ABDOMEN AND PELVIS WITHOUT CONTRAST TECHNIQUE: Multidetector CT imaging of the abdomen and pelvis was performed following the standard protocol without IV contrast. COMPARISON:  None. FINDINGS: Evaluation of this exam is limited in the absence of intravenous contrast. Minimal bibasilar dependent atelectatic changes. The visualized lung bases are otherwise clear. No intra-abdominal  free air or free fluid. Multiple hepatic hypodense lesions measuring up to 1.8 cm in the left lobe of the liver. This lesions are incompletely characterized but may represent or hemangioma. Nonemergent MRI or dedicated multiphasic contrast enhanced CT is recommended for further characterization. The gallbladder, pancreas, spleen, and the right adrenal gland appear unremarkable. There is a 12 mm indeterminate left adrenal nodule, likely an adenoma. There is no hydronephrosis on either side. Punctate nonobstructing right renal calculus may be present. Subcentimeter ill-defined left renal parenchymal hypodense lesion is not characterized on this noncontrast study but may represent cysts. Mild nonspecific bilateral perinephric stranding noted. Correlation with urinalysis recommended to exclude UTI. The visualized ureters appear unremarkable. The urinary bladder is decompressed around a Foley catheter. The prostate gland is enlarged measuring 8.6 cm in transverse diameter. There are scattered sigmoid diverticula without active inflammatory changes. Moderate stool throughout the colon. No evidence of bowel obstruction or inflammation. Normal appendix. There is aortoiliac atherosclerotic disease. There is a 2.6 cm infrarenal  aortic ectasia. No portal venous gas identified. There is no adenopathy. The abdominal wall soft tissues appear unremarkable. There is degenerative changes of the spine. No acute fracture. IMPRESSION: No hydronephrosis or obstructing stone. Correlation with urinalysis recommended to exclude UTI. Enlarged prostate gland. No evidence of bowel obstruction or inflammation.  Normal appendix. Sigmoid diverticulosis. Small scattered hepatic hypodense lesions. Electronically Signed   By: Anner Crete M.D.   On: 06/29/2015 04:20    Assessment:   Casey Murphy is a 70 y.o. male with recent admit for UTI with foley cath in place readmitted with sob and R facial rash and redness. I do think it is likely zoster which has become superinfected.  I have swabbed for both vzv and routine culture. No vision changes, does not extend to forehead  Recommendations Cont valtrex, vanco and ctx pending cx results Lesions scabbed over so can dc isolation If VZV + and gets any worse would consult ophtho. Thank you very much for allowing me to participate in the care of this patient. Please call with questions.   Cheral Marker. Ola Spurr, MD

## 2015-07-05 NOTE — Care Management Obs Status (Signed)
MEDICARE OBSERVATION STATUS NOTIFICATION   Patient Details  Name: Maverick Dieudonne MRN: [id number] Date of Birth: [birth year]   Medicare Observation Status Notification Given:  Yes    Marily Memos, RN 07/05/2015, 9:56 AM

## 2015-07-05 NOTE — Consult Note (Signed)
  Pt seen and examined. Please seeD. Martin's notes. Readmitted this time with shingles. Decreased oral intake since last hospitalization, probably due to infections. Some nausea. Otherwise, denies other GI symptoms. Nausea meds prn. Calorie count daily. Otherwise, pt can f/u as outpatient later. EGD as outpatient if anorexia and nausea persist. Thanks.

## 2015-07-05 NOTE — Consult Note (Signed)
[facility] Cardiology Consultation Note  Patient ID: Casey Murphy, MRN: [id number], DOB/AGE: 72/12/1945 72 y.o. Admit date: 07/04/2015   Date of Consult: 07/05/2015 Primary Physician: Quita Skye, MD Primary Cardiologist: [facility]  Chief Complaint:  Chief Complaint  Patient presents with  . Rash  . Shortness of Breath   Reason for Consult: known coronary artery disease with essential hypertension and elevated troponin  HPI: 70 y.o. male with recent urinary tract infection for which the patient was admitted to the hospital with apparent bacteremia placed on appropriate medication management and discharged home and then returned with concerns of worsening issues. The patient now is on other N Parikh medication management. Prior to this the patient did not have any chest pain shortness of breath syncope dizziness nausea or diaphoresis. He has had appropriate treatment of essential hypertension with medications listed below. Additionally he is on high intensity cholesterol therapy as well for known coronary artery disease status post previous and/or remote myocardial infarction. Currently the patient feels quite well and there is elevated troponin is more consistent with a possible demand ischemia  Past Medical History  Diagnosis Date  . Hypertension   . BPH (benign prostatic hyperplasia)   . CHF (congestive heart failure) (HCC)   . MRSA bacteremia   . CAD (coronary artery disease)   . MI, old       Surgical History:  Past Surgical History  Procedure Laterality Date  . None       Home Meds: Prior to Admission medications   Medication Sig Start Date End Date Taking? Authorizing Provider  aspirin EC 81 MG tablet Take 81 mg by mouth daily.   Yes Historical Provider, MD  cephALEXin (KEFLEX) 500 MG capsule Take 1 capsule (500 mg total) by mouth every 8 (eight) hours. 07/03/15  Yes Katharina Caper, MD  cholestyramine light (PREVALITE) 4 g packet Take 1 packet (4 g total) by mouth  daily. 07/03/15  Yes Katharina Caper, MD  feeding supplement, ENSURE ENLIVE, (ENSURE ENLIVE) LIQD Take 237 mLs by mouth 3 (three) times daily between meals. 07/03/15  Yes Katharina Caper, MD  loperamide (IMODIUM) 2 MG capsule Take 1 capsule (2 mg total) by mouth every 4 (four) hours as needed for diarrhea or loose stools. 07/03/15  Yes Katharina Caper, MD  losartan (COZAAR) 100 MG tablet Take 100 mg by mouth daily.   Yes Historical Provider, MD  metoprolol succinate (TOPROL-XL) 50 MG 24 hr tablet Take 50 mg by mouth daily.    Yes Historical Provider, MD  Multiple Vitamin (MULTIVITAMIN WITH MINERALS) TABS tablet Take 1 tablet by mouth daily.   Yes Historical Provider, MD  simvastatin (ZOCOR) 40 MG tablet Take 40 mg by mouth at bedtime.   Yes Historical Provider, MD  tamsulosin (FLOMAX) 0.4 MG CAPS capsule Take 0.4 mg by mouth daily.   Yes Historical Provider, MD    Inpatient Medications:  . aspirin EC  325 mg Oral Daily  . cefTRIAXone (ROCEPHIN)  IV  1 g Intravenous Q24H  . cholestyramine light  4 g Oral Daily  . feeding supplement (ENSURE ENLIVE)  237 mL Oral TID WC & HS  . losartan  100 mg Oral Daily  . metoprolol succinate  50 mg Oral Daily  . multivitamin with minerals  1 tablet Oral Daily  . pantoprazole  40 mg Oral Daily  . simvastatin  40 mg Oral QHS  . sodium chloride flush  3 mL Intravenous Q12H  . sodium chloride flush  3 mL  Intravenous Q12H  . tamsulosin  0.4 mg Oral Daily  . terazosin  2 mg Oral QHS  . valACYclovir  1,000 mg Oral TID  . vancomycin  1,000 mg Intravenous Once  . vancomycin  1,000 mg Intravenous Once  . [START ON 07/06/2015] vancomycin  1,000 mg Intravenous Q12H      Allergies:  Allergies  Allergen Reactions  . Lisinopril Swelling  . Sulfa Antibiotics Hives and Swelling    Social History   Social History  . Marital Status: Married    Spouse Name: N/A  . Number of Children: N/A  . Years of Education: N/A   Occupational History  . retired    Social  History Main Topics  . Smoking status: Former Smoker -- 1.00 packs/day for 55 years    Types: Cigarettes    Quit date: 05/29/2015  . Smokeless tobacco: Never Used  . Alcohol Use: 0.6 oz/week    1 Standard drinks or equivalent per week     Comment: wine occasionally  . Drug Use: No  . Sexual Activity: Not on file   Other Topics Concern  . Not on file   Social History Narrative   Lives with wife.     Family History  Problem Relation Age of Onset  . Hypertension       Review of Systems Positive for urinary tract infection with frequent urination Negative for: General:  chills, fever, night sweats or weight changes.  Cardiovascular: PND orthopnea syncope dizziness  Dermatological skin lesions rashes Respiratory: Cough congestion Urologic: Positive for Frequent urination urination at night and hematuria Abdominal: negative for nausea, vomiting, diarrhea, bright red blood per rectum, melena, or hematemesis Neurologic: negative for visual changes, and/or hearing changes  All other systems reviewed and are otherwise negative except as noted above.  Labs:  Recent Labs  07/04/15 1615 07/04/15 2101 07/05/15 0441 07/05/15 1022  TROPONINI 0.57* 0.55* 0.54* 0.39*   Lab Results  Component Value Date   WBC 15.7* 07/05/2015   HGB 14.4 07/05/2015   HCT 43.2 07/05/2015   MCV 86.6 07/05/2015   PLT 99* 07/05/2015    Recent Labs Lab 07/04/15 1615 07/05/15 0441  NA 137 140  K 3.7 3.6  CL 111 109  CO2 22 24  BUN 23* 25*  CREATININE 1.11 1.25*  CALCIUM 8.2* 8.0*  PROT 6.9  --   BILITOT 0.9  --   ALKPHOS 166*  --   ALT 49  --   AST 52*  --   GLUCOSE 109* 93   No results found for: CHOL, HDL, LDLCALC, TRIG No results found for: DDIMER  Radiology/Studies:  Dg Chest 2 View  07/04/2015  CLINICAL DATA:  Sepsis.  Shortness of breath. EXAM: CHEST  2 VIEW COMPARISON:  06/29/2015 FINDINGS: Bibasilar airspace opacities likely reflect atelectasis. Heart is normal size. Trace  bilateral pleural effusions. No acute bony abnormality. IMPRESSION: Trace bilateral effusions with bibasilar atelectasis. Electronically Signed   By: Charlett Nose M.D.   On: 07/04/2015 16:34   Dg Abd 1 View  07/05/2015  CLINICAL DATA:  Abdominal pain, bloating EXAM: ABDOMEN - 1 VIEW COMPARISON:  None. FINDINGS: There is normal small bowel gas pattern. Some colonic gas noted in right colon and transverse colon. Minimal colonic gas in descending colon. Mild degenerative changes lumbar spine. No free abdominal air. IMPRESSION: Normal small bowel gas pattern. No free abdominal air. Colonic gas in proximal colon. Small amount of colonic gas in distal colon. Electronically Signed   By: Lang Snow  Pop M.D.   On: 07/05/2015 14:37   Nm Pulmonary Perf And Vent  07/05/2015  CLINICAL DATA:  Two week history of shortness of breath EXAM: NUCLEAR MEDICINE VENTILATION - PERFUSION LUNG SCAN Views: Anterior, posterior, left lateral, right lateral, RPO, LPO, RAO, LAO -ventilation and perfusion RADIOPHARMACEUTICALS:  31.611 millicurie Technetium-34m DTPA aerosol inhalation and 4.215 millicurie Technetium-45m MAA IV COMPARISON:  Chest radiograph July 04, 2015 FINDINGS: Ventilation: There is slight decreased uptake throughout the right mid and upper lung zones compared to the left side, were uptake is essentially normal. There are no focal segmental or significant subsegmental defects. Perfusion: Radiotracer uptake bilaterally is homogeneous and symmetric. No appreciable perfusion defects are identified. IMPRESSION: No perfusion defects are identified. This study constitutes a very low probability of pulmonary embolus. Relative decreased uptake in portions of the right lung compared to the left raise question of a degree of COPD involving portions of the right lung, primarily in the upper lobe region. Electronically Signed   By: Bretta Bang III M.D.   On: 07/05/2015 16:05   Dg Chest Port 1 View  06/29/2015  CLINICAL DATA:   Acute onset of shortness of breath. Initial encounter. EXAM: PORTABLE CHEST 1 VIEW COMPARISON:  Chest radiograph performed earlier today at 3:02 a.m. FINDINGS: The lungs are relatively well expanded. Vascular congestion is noted, with mild bibasilar atelectasis. No pleural effusion or pneumothorax is seen The cardiomediastinal silhouette is normal in size. No acute osseous abnormalities identified. IMPRESSION: Vascular congestion, with mild bibasilar atelectasis. Electronically Signed   By: Roanna Raider M.D.   On: 06/29/2015 23:38   Dg Chest Port 1 View  06/29/2015  CLINICAL DATA:  Acute onset of fever, nausea and vomiting. Recent Foley catheter placement. Initial encounter. EXAM: PORTABLE CHEST 1 VIEW COMPARISON:  None. FINDINGS: The lungs are well-aerated. Mild bibasilar atelectasis is noted. There is no evidence of pleural effusion or pneumothorax. The cardiomediastinal silhouette is within normal limits. No acute osseous abnormalities are seen. IMPRESSION: Mild bibasilar atelectasis noted.  Lungs otherwise clear. Electronically Signed   By: Roanna Raider M.D.   On: 06/29/2015 03:16   Ct Renal Stone Study  06/29/2015  CLINICAL DATA:  70 year old male with urinary retention and sepsis and hematuria. EXAM: CT ABDOMEN AND PELVIS WITHOUT CONTRAST TECHNIQUE: Multidetector CT imaging of the abdomen and pelvis was performed following the standard protocol without IV contrast. COMPARISON:  None. FINDINGS: Evaluation of this exam is limited in the absence of intravenous contrast. Minimal bibasilar dependent atelectatic changes. The visualized lung bases are otherwise clear. No intra-abdominal free air or free fluid. Multiple hepatic hypodense lesions measuring up to 1.8 cm in the left lobe of the liver. This lesions are incompletely characterized but may represent or hemangioma. Nonemergent MRI or dedicated multiphasic contrast enhanced CT is recommended for further characterization. The gallbladder, pancreas,  spleen, and the right adrenal gland appear unremarkable. There is a 12 mm indeterminate left adrenal nodule, likely an adenoma. There is no hydronephrosis on either side. Punctate nonobstructing right renal calculus may be present. Subcentimeter ill-defined left renal parenchymal hypodense lesion is not characterized on this noncontrast study but may represent cysts. Mild nonspecific bilateral perinephric stranding noted. Correlation with urinalysis recommended to exclude UTI. The visualized ureters appear unremarkable. The urinary bladder is decompressed around a Foley catheter. The prostate gland is enlarged measuring 8.6 cm in transverse diameter. There are scattered sigmoid diverticula without active inflammatory changes. Moderate stool throughout the colon. No evidence of bowel obstruction or inflammation. Normal appendix.  There is aortoiliac atherosclerotic disease. There is a 2.6 cm infrarenal aortic ectasia. No portal venous gas identified. There is no adenopathy. The abdominal wall soft tissues appear unremarkable. There is degenerative changes of the spine. No acute fracture. IMPRESSION: No hydronephrosis or obstructing stone. Correlation with urinalysis recommended to exclude UTI. Enlarged prostate gland. No evidence of bowel obstruction or inflammation.  Normal appendix. Sigmoid diverticulosis. Small scattered hepatic hypodense lesions. Electronically Signed   By: Elgie Collard M.D.   On: 06/29/2015 04:20    EKG: Normal sinus rhythm otherwise normal EKG  Weights: Filed Weights   07/04/15 1604 07/04/15 2047  Weight: 220 lb (99.791 kg) 213 lb 12.8 oz (96.979 kg)     Physical Exam: Blood pressure 124/73, pulse 66, temperature 99.1 F (37.3 C), temperature source Oral, resp. rate 16, height 6\' 3"  (1.905 m), weight 213 lb 12.8 oz (96.979 kg), SpO2 96 %. Body mass index is 26.72 kg/(m^2). General: Well developed, well nourished, in no acute distress. Head eyes ears nose throat:  Normocephalic, atraumatic, sclera non-icteric, no xanthomas, nares are without discharge. No apparent thyromegaly and/or mass  Lungs: Normal respiratory effort.  no wheezes, no rales, no rhonchi.  Heart: RRR with normal S1 S2. no murmur gallop, no rub, PMI is normal size and placement, carotid upstroke normal without bruit, jugular venous pressure is normal Abdomen: Soft, non-tender, non-distended with normoactive bowel sounds. No hepatomegaly. No rebound/guarding. No obvious abdominal masses. Abdominal aorta is normal size   Extremities: No edema. no cyanosis, no clubbing, no ulcers  Peripheral : 2+ bilateral upper extremity pulses, 2+ bilateral femoral pulses, 2+ bilateral dorsal pedal pulse Neuro: Alert and oriented. No facial asymmetry. No focal deficit. Moves all extremities spontaneously. Musculoskeletal: Normal muscle tone without kyphosis Psych:  Responds to questions appropriately with a normal affect.    Assessment: 70 year old male with known coronary atherosclerosis essential hypertension previous remote myocardial infarction with a urinary tract infection and elevated troponin more consistent with demand ischemia and no current evidence of acute coronary syndrome  Plan: 1. Continue appropriate treatment of urinary tract infection 2. No further cardiac diagnostics at this time due to no evidence of significant symptoms with elevated troponin 3. Begin ambulation and follow for any further significant symptoms requiring additional treatment options 4. Continue high intensity cholesterol therapy for further risk reduction cardiovascular event 5. Hypertension control with current medical regimen without change 6. Follow-up in one to 2 weeks after hospitalization for further adjustments of treatment options of cardiovascular disease  Signed, Lamar Blinks M.D. [facility] Card[facility] 07/05/2015, 5:07 PM

## 2015-07-05 NOTE — Care Management Note (Signed)
Case Management Note  Patient Details  Name: Casey Murphy MRN: [id number] Date of Birth: 1945-09-28  Subjective/Objective:   RNCM assessment for discharge planning. Patient is a readmission. He just left [facility] 01/23. Readmitted with shortness of breath. Troponins elevated. On RA with VSS.  Patient states he lives at home with his wife. He uses no assistive devices or home O2. He has no home health. PCP is Dr. Buford Dresser. He denies issues obtaining medications, copays, medical care of transportation.                  Action/Plan: Following progression.   Expected Discharge Date:                  Expected Discharge Plan:     In-House Referral:     Discharge planning Services  CM Consult  Post Acute Care Choice:    Choice offered to:     DME Arranged:    DME Agency:     HH Arranged:    HH Agency:     Status of Service:  In process, will continue to follow  Medicare Important Message Given:    Date Medicare IM Given:    Medicare IM give by:    Date Additional Medicare IM Given:    Additional Medicare Important Message give by:     If discussed at Long Length of Stay Meetings, dates discussed:    Additional Comments:  Marily Memos, RN 07/05/2015, 9:57 AM

## 2015-07-05 NOTE — Progress Notes (Signed)
[facility] [place] [facility]   PATIENT NAME: Casey Murphy    MR#:  [id number]  DATE OF BIRTH:  [birth year]  SUBJECTIVE:  CHIEF COMPLAINT:   Chief Complaint  Patient presents with  . Rash  . Shortness of Breath   patient is 70 year old African-American American male was past medical history of recent admission for Escherichia coli sepsis and discharged on Keflex comes back to the hospital with complaints of worsening rash on his face, nausea, shortness of breath. He was diagnosed with shingles and admitted to the hospital for acyclovir. He denies any significant change in the rash or pain, admits of nausea and poor oral intake as well as early satiety, denies any abdominal pain. His wife is wondering if Foley catheter should be removed in the hospital.   Review of Systems  Constitutional: Negative for fever, chills and weight loss.  HENT: Negative for congestion.   Eyes: Negative for blurred vision and double vision.  Respiratory: Positive for shortness of breath. Negative for cough, sputum production and wheezing.   Cardiovascular: Negative for chest pain, palpitations, orthopnea, leg swelling and PND.  Gastrointestinal: Positive for nausea. Negative for vomiting, abdominal pain, diarrhea, constipation and blood in stool.  Genitourinary: Negative for dysuria, urgency, frequency and hematuria.  Musculoskeletal: Negative for falls.  Skin: Positive for rash.  Neurological: Negative for dizziness, tremors, focal weakness and headaches.  Endo/Heme/Allergies: Does not bruise/bleed easily.  Psychiatric/Behavioral: Negative for depression. The patient does not have insomnia.     VITAL SIGNS: Blood pressure 124/73, pulse 66, temperature 99.1 F (37.3 C), temperature source Oral, resp. rate 16, height  (1.905 m), weight 96.979 kg (213 lb 12.8 oz), SpO2 96 %.  PHYSICAL EXAMINATION:   GENERAL:  70 y.o.-year-old patient lying in the bed with no acute  distress.  EYES: Pupils equal, round, reactive to light and accommodation. Right eye conjunctival injection was noted to some purulent discharge. Rash was noted, scaling on the face on the right side cheek and the left side of chin , no vesicles   were noted.  No scleral icterus. Extraocular muscles intact.  HEENT: Head atraumatic, normocephalic. Oropharynx and nasopharynx clear.  NECK:  Supple, no jugular venous distention. No thyroid enlargement, no tenderness.  LUNGS: Normal breath sounds bilaterally, no wheezing, rales,rhonchi or crepitation. No use of accessory muscles of respiration.  CARDIOVASCULAR: S1, S2 normal. No murmurs, rubs, or gallops.  ABDOMEN: Soft, nontender, nondistended. Bowel sounds present. No organomegaly or mass.  EXTREMITIES: No pedal edema, cyanosis, or clubbing.  NEUROLOGIC: Cranial nerves II through XII are intact. Muscle strength 5/5 in all extremities. Sensation intact. Gait not checked.  PSYCHIATRIC: The patient is alert and oriented x 3.  SKIN: No obvious rash, lesion, or ulcer. Foley catheter is present and drains clear urine  ORDERS/RESULTS REVIEWED:   CBC  Recent Labs Lab 06/29/15 0301  07/01/15 0548 07/02/15 0708 07/03/15 0529 07/04/15 1615 07/05/15 0441  WBC 4.7  < > 26.4* 24.0* 20.6* 19.5* 15.7*  HGB 16.4  < > 13.8 13.6 13.9 14.8 14.4  HCT 48.8  < > 42.8 41.2 41.6 44.9 43.2  PLT 220  < > 75* 86* 76* 92* 99*  MCV 88.6  < > 87.6 87.7 86.4 85.5 86.6  MCH 29.7  < > 28.3 28.9 29.0 28.3 28.9  MCHC 33.6  < > 32.3 33.0 33.5 33.1 33.4  RDW 14.4  < > 15.1* 15.0* 14.7* 14.8* 15.1*  LYMPHSABS 0.7*  --   --   --   --  3.5  --   MONOABS 0.0*  --   --   --   --  1.4*  --   EOSABS 0.1  --   --   --   --  0.0  --   BASOSABS 0.0  --   --   --   --  0.0  --   < > = values in this interval not displayed. ------------------------------------------------------------------------------------------------------------------  Chemistries   Recent Labs Lab  06/29/15 0301  07/01/15 0548 07/02/15 0708 07/03/15 0529 07/04/15 1615 07/05/15 0441  NA 138  < > 142 140 142 137 140  K 4.5  < > 3.9 3.8 3.9 3.7 3.6  CL 104  < > 115* 116* 117* 111 109  CO2 24  < > 17* 19* 19* 22 24  GLUCOSE 103*  < > 78 92 106* 109* 93  BUN 31*  < > 41* 29* 27* 23* 25*  CREATININE 1.73*  < > 1.71* 1.10 1.15 1.11 1.25*  CALCIUM 9.2  < > 7.6* 7.7* 8.3* 8.2* 8.0*  AST 24  --   --   --   --  52*  --   ALT 21  --   --   --   --  49  --   ALKPHOS 70  --   --   --   --  166*  --   BILITOT 1.0  --   --   --   --  0.9  --   < > = values in this interval not displayed. ------------------------------------------------------------------------------------------------------------------ estimated creatinine clearance is 64.8 mL/min (by C-G formula based on Cr of 1.25). ------------------------------------------------------------------------------------------------------------------ No results for input(s): TSH, T4TOTAL, T3FREE, THYROIDAB in the last 72 hours.  Invalid input(s): FREET3  Cardiac Enzymes  Recent Labs Lab 07/04/15 2101 07/05/15 0441 07/05/15 1022  TROPONINI 0.55* 0.54* 0.39*   ------------------------------------------------------------------------------------------------------------------ Invalid input(s): POCBNP ---------------------------------------------------------------------------------------------------------------  RADIOLOGY: Dg Chest 2 View  07/04/2015  CLINICAL DATA:  Sepsis.  Shortness of breath. EXAM: CHEST  2 VIEW COMPARISON:  06/29/2015 FINDINGS: Bibasilar airspace opacities likely reflect atelectasis. Heart is normal size. Trace bilateral pleural effusions. No acute bony abnormality. IMPRESSION: Trace bilateral effusions with bibasilar atelectasis. Electronically Signed   By: Charlett Nose M.D.   On: 07/04/2015 16:34    EKG:  Orders placed or performed during the hospital encounter of 07/04/15  . ED EKG  . ED EKG  . EKG 12-Lead  . EKG  12-Lead    ASSESSMENT AND PLAN:  Active Problems:   SOB (shortness of breath)  #1. Dyspnea, very likely due to some element of fluid overload in view of history of cardiomyopathy with ejection fraction of around 25%, patient would benefit from Lasix while he is on IV antibiotics in the hospital, watching his kidney function closely #2. Elevated troponin, likely demand ischemia, getting cardiologist involved for further recommendations, continue Cozaar, Toprol, aspirin #3. Renal insufficiency, follow with diuresis, I doubt this dehydration related #4. Leukocytosis, improving with Rocephin #5. Thrombocytopenia, seemed to be better since discharge, HIT is pending, as continue Lovenox. Initiate patient on sequential compression #6. Nausea with poor by mouth intake, getting KUB to rule out ileus, initiating patient on Protonix, get gastroenterologist involved for further recommendations and possibly follow-up as outpatient #7 rash, I doubt that this is shingles, suspected erysipelas, start patient on vancomycin, get wound cultures, swab from the eye area, which exhibits some purulence. Getting infectious disease specialist to see patient in consultation #8. BPH with urinary retention status  post Foley catheter placement, patient's prostate is very large on CT scan done in the recent past, patient was seen by urologist and recommended to be discharged his Foley catheter and outpatient urology follow-up, discussed this patient's wife extensively and explained that removing the catheter while he is in the hospital may warrant urology urgent consultation again.   Management plans discussed with the patient, family and they are in agreement.   DRUG ALLERGIES:  Allergies  Allergen Reactions  . Lisinopril Swelling  . Sulfa Antibiotics Hives and Swelling    CODE STATUS:     Code Status Orders        Start     Ordered   07/04/15 1809  Full code   Continuous     07/04/15 1809    Code Status  History    Date Active Date Inactive Code Status Order ID Comments User Context   06/29/2015  6:02 AM 07/03/2015  9:32 PM Full Code [id number]  Ihor Austin, MD ED      TOTAL TIME TAKING CARE OF THIS PATIENT: 60 minutes.  Approximately 25 minutes spent on discussions about patient's medical condition, treatment plan and discharge planning was patient's wife  Valdez Brannan M.D on 07/05/2015 at 1:37 PM  Between 7am to 6pm - Pager - [phone]  After 6pm go to [email or web address] - password [email or web address] [facility]  [facility]  Office  [phone]  CC: Primary care physician; Quita Skye, MD

## 2015-07-05 NOTE — Progress Notes (Signed)
Per Dr. Sampson Goon okay to d/c precaution for shingles. Trudee Kuster

## 2015-07-05 NOTE — Consult Note (Signed)
Reason for Consult: Poor PO Intake Referring Physician: Dr. Theodoro Grist  Casey Murphy is an 70 y.o. male with a history of Congestive heart failure, CAD and recent discharge for treatment of E. Coli Sepsis and UTI.    HPI: Casey Murphy has been readmitted for evaluation of shortness of breath, elevated troponin levels and facial rash. He was discharged on 07/03/15 from [facility] after treatment for E. Coli Sepsis and UTI. He experienced a decrease appetite during this admission and had an appointment with Dawson Bills, NP on 07/06/15 for further evaluation which has been cancelled.   He return on yesterday for evaluation of shortness of breath. His Dyspnea is thought to be related to fluid overload with a history of cardiomyopathy and Ejection fraction of about 25 %.  His elevated troponin levels thought to be related to demand ischemia. He presented also with nausea and poor intake. KUB negative for ileus. Protonix started. He also is receiving Vancomycin, Rocephin and Valtrex.   He denies any n/v, abdominal pain, constipation or diarrhea. Stating he has no desire to eat because the food taste different which started during previous admission.  He and Mrs. Cotta says he had a normal appetite prior to first admission and antibiotics. He recalls having had a normal colonoscopy 5 years ago. Has never had an EGD.   Past Medical History  Diagnosis Date  . Hypertension   . BPH (benign prostatic hyperplasia)   . CHF (congestive heart failure) ([facility])   . MRSA bacteremia   . CAD (coronary artery disease)   . MI, old     Past Surgical History  Procedure Laterality Date  . None      Family History  Problem Relation Age of Onset  . Hypertension      Social History:  reports that he quit smoking about 5 weeks ago. His smoking use included Cigarettes. He has a 55 pack-year smoking history. He has never used smokeless tobacco. He reports that he drinks about 0.6 oz of alcohol per week. He reports that he  does not use illicit drugs.  Allergies:  Allergies  Allergen Reactions  . Lisinopril Swelling  . Sulfa Antibiotics Hives and Swelling    Medications:  I have reviewed the patient's current medications. Prior to Admission:  Prescriptions prior to admission  Medication Sig Dispense Refill Last Dose  . aspirin EC 81 MG tablet Take 81 mg by mouth daily.   Past Week at Unknown time  . cephALEXin (KEFLEX) 500 MG capsule Take 1 capsule (500 mg total) by mouth every 8 (eight) hours. 36 capsule 0 07/04/2015 at Unknown time  . cholestyramine light (PREVALITE) 4 g packet Take 1 packet (4 g total) by mouth daily. 20 packet 0 07/03/2015 at Unknown time  . feeding supplement, ENSURE ENLIVE, (ENSURE ENLIVE) LIQD Take 237 mLs by mouth 3 (three) times daily between meals. 237 mL 12 07/03/2015 at Unknown time  . loperamide (IMODIUM) 2 MG capsule Take 1 capsule (2 mg total) by mouth every 4 (four) hours as needed for diarrhea or loose stools. 30 capsule 0 Past Week at Unknown time  . losartan (COZAAR) 100 MG tablet Take 100 mg by mouth daily.   Past Week at Unknown time  . metoprolol succinate (TOPROL-XL) 50 MG 24 hr tablet Take 50 mg by mouth daily.    Past Week at Unknown time  . Multiple Vitamin (MULTIVITAMIN WITH MINERALS) TABS tablet Take 1 tablet by mouth daily.   Past Week at Unknown time  .  simvastatin (ZOCOR) 40 MG tablet Take 40 mg by mouth at bedtime.   unknown at unknown   . tamsulosin (FLOMAX) 0.4 MG CAPS capsule Take 0.4 mg by mouth daily.   Past Week at Unknown time   Scheduled: . aspirin EC  325 mg Oral Daily  . cefTRIAXone (ROCEPHIN)  IV  1 g Intravenous Q24H  . cholestyramine light  4 g Oral Daily  . feeding supplement (ENSURE ENLIVE)  237 mL Oral TID WC & HS  . losartan  100 mg Oral Daily  . metoprolol succinate  50 mg Oral Daily  . multivitamin with minerals  1 tablet Oral Daily  . pantoprazole  40 mg Oral Daily  . simvastatin  40 mg Oral QHS  . sodium chloride flush  3 mL  Intravenous Q12H  . sodium chloride flush  3 mL Intravenous Q12H  . tamsulosin  0.4 mg Oral Daily  . terazosin  2 mg Oral QHS  . valACYclovir  1,000 mg Oral TID  . vancomycin  1,000 mg Intravenous Once  . [START ON 07/06/2015] vancomycin  1,000 mg Intravenous Q12H   Continuous:  TXM:IWOEHO chloride, acetaminophen **OR** acetaminophen, loperamide, ondansetron **OR** ondansetron (ZOFRAN) IV, sodium chloride flush Anti-infectives    Start     Dose/Rate Route Frequency Ordered Stop   07/06/15 1000  vancomycin (VANCOCIN) IVPB 1000 mg/200 mL premix     1,000 mg 200 mL/hr over 60 Minutes Intravenous Every 12 hours 07/05/15 1344     07/06/15 0800  vancomycin (VANCOCIN) IVPB 1000 mg/200 mL premix  Status:  Discontinued     1,000 mg 200 mL/hr over 60 Minutes Intravenous Every 12 hours 07/05/15 1338 07/05/15 1344   07/05/15 2200  vancomycin (VANCOCIN) IVPB 1000 mg/200 mL premix     1,000 mg 200 mL/hr over 60 Minutes Intravenous  Once 07/05/15 1344     07/05/15 2000  vancomycin (VANCOCIN) IVPB 1000 mg/200 mL premix  Status:  Discontinued     1,000 mg 200 mL/hr over 60 Minutes Intravenous  Once 07/05/15 1338 07/05/15 1344   07/05/15 1400  vancomycin (VANCOCIN) IVPB 1000 mg/200 mL premix     1,000 mg 200 mL/hr over 60 Minutes Intravenous  Once 07/05/15 1330 07/05/15 1711   07/04/15 2200  valACYclovir (VALTREX) tablet 1,000 mg     1,000 mg Oral 3 times daily 07/04/15 1815     07/04/15 1830  cefTRIAXone (ROCEPHIN) 1 g in dextrose 5 % 50 mL IVPB     1 g 100 mL/hr over 30 Minutes Intravenous Every 24 hours 07/04/15 1816     07/04/15 1745  valACYclovir (VALTREX) tablet 1,000 mg     1,000 mg Oral  Once 07/04/15 1740 07/04/15 1906      Results for orders placed or performed during the hospital encounter of 07/04/15 (from the past 48 hour(s))  CBC with Differential     Status: Abnormal   Collection Time: 07/04/15  4:15 PM  Result Value Ref Range   WBC 19.5 (H) 3.8 - 10.6 K/uL   RBC 5.25 4.40 -  5.90 MIL/uL   Hemoglobin 14.8 13.0 - 18.0 g/dL   HCT 44.9 40.0 - 52.0 %   MCV 85.5 80.0 - 100.0 fL   MCH 28.3 26.0 - 34.0 pg   MCHC 33.1 32.0 - 36.0 g/dL   RDW 14.8 (H) 11.5 - 14.5 %   Platelets 92 (L) 150 - 440 K/uL   Neutrophils Relative % 67 %   Lymphocytes Relative 18 %  Monocytes Relative 7 %   Eosinophils Relative 0 %   Basophils Relative 0 %   Band Neutrophils 6 %   Metamyelocytes Relative 2 %   Myelocytes 0 %   Promyelocytes Absolute 0 %   Blasts 0 %   nRBC 0 0 /100 WBC   Other 0 %   Neutro Abs 14.6 (H) 1.4 - 6.5 K/uL   Lymphs Abs 3.5 1.0 - 3.6 K/uL   Monocytes Absolute 1.4 (H) 0.2 - 1.0 K/uL   Eosinophils Absolute 0.0 0 - 0.7 K/uL   Basophils Absolute 0.0 0 - 0.1 K/uL   WBC Morphology ATYPICAL LYMPHOCYTES   Troponin I     Status: Abnormal   Collection Time: 07/04/15  4:15 PM  Result Value Ref Range   Troponin I 0.57 (H) <0.031 ng/mL    Comment: READ BACK AND VERIFIED WITH JANE RYAN AT 1702 07/04/15 [facility]        POSSIBLE MYOCARDIAL ISCHEMIA. SERIAL TESTING RECOMMENDED.   Comprehensive metabolic panel     Status: Abnormal   Collection Time: 07/04/15  4:15 PM  Result Value Ref Range   Sodium 137 135 - 145 mmol/L   Potassium 3.7 3.5 - 5.1 mmol/L   Chloride 111 101 - 111 mmol/L   CO2 22 22 - 32 mmol/L   Glucose, Bld 109 (H) 65 - 99 mg/dL   BUN 23 (H) 6 - 20 mg/dL   Creatinine, Ser 1.11 0.61 - 1.24 mg/dL   Calcium 8.2 (L) 8.9 - 10.3 mg/dL   Total Protein 6.9 6.5 - 8.1 g/dL   Albumin 2.9 (L) 3.5 - 5.0 g/dL   AST 52 (H) 15 - 41 U/L   ALT 49 17 - 63 U/L   Alkaline Phosphatase 166 (H) 38 - 126 U/L   Total Bilirubin 0.9 0.3 - 1.2 mg/dL   GFR calc non Af Amer >60 >60 mL/min   GFR calc Af Amer >60 >60 mL/min    Comment: (NOTE) The eGFR has been calculated using the CKD EPI equation. This calculation has not been validated in all clinical situations. eGFR's persistently <60 mL/min signify possible Chronic Kidney Disease.    Anion gap 4 (L) 5 - 15  Urine culture      Status: None (Preliminary result)   Collection Time: 07/04/15  8:24 PM  Result Value Ref Range   Specimen Description URINE, RANDOM    Special Requests NONE    Culture NO GROWTH < 24 HOURS    Report Status PENDING   Urinalysis complete, with microscopic ([facility] only)     Status: Abnormal   Collection Time: 07/04/15  8:24 PM  Result Value Ref Range   Color, Urine YELLOW (A) YELLOW   APPearance CLOUDY (A) CLEAR   Glucose, UA NEGATIVE NEGATIVE mg/dL   Bilirubin Urine NEGATIVE NEGATIVE   Ketones, ur NEGATIVE NEGATIVE mg/dL   Specific Gravity, Urine 1.017 1.005 - 1.030   Hgb urine dipstick 2+ (A) NEGATIVE   pH 5.0 5.0 - 8.0   Protein, ur 100 (A) NEGATIVE mg/dL   Nitrite NEGATIVE NEGATIVE   Leukocytes, UA NEGATIVE NEGATIVE   RBC / HPF TOO NUMEROUS TO COUNT 0 - 5 RBC/hpf   WBC, UA 6-30 0 - 5 WBC/hpf   Bacteria, UA NONE SEEN NONE SEEN   Squamous Epithelial / LPF NONE SEEN NONE SEEN   Mucous PRESENT    Budding Yeast PRESENT   Troponin I     Status: Abnormal   Collection Time: 07/04/15  9:01  PM  Result Value Ref Range   Troponin I 0.55 (H) <0.031 ng/mL    Comment: PREVIOUS RESULT CALLED AT 1702 07/04/2015 BY MLZ        POSSIBLE MYOCARDIAL ISCHEMIA. SERIAL TESTING RECOMMENDED.   Troponin I     Status: Abnormal   Collection Time: 07/05/15  4:41 AM  Result Value Ref Range   Troponin I 0.54 (H) <0.031 ng/mL    Comment: PREVIOUS RESULT CALLED AT 1702 07/04/15.PM[facility]        POSSIBLE MYOCARDIAL ISCHEMIA. SERIAL TESTING RECOMMENDED.   CBC     Status: Abnormal   Collection Time: 07/05/15  4:41 AM  Result Value Ref Range   WBC 15.7 (H) 3.8 - 10.6 K/uL   RBC 4.99 4.40 - 5.90 MIL/uL   Hemoglobin 14.4 13.0 - 18.0 g/dL   HCT 43.2 40.0 - 52.0 %   MCV 86.6 80.0 - 100.0 fL   MCH 28.9 26.0 - 34.0 pg   MCHC 33.4 32.0 - 36.0 g/dL   RDW 15.1 (H) 11.5 - 14.5 %   Platelets 99 (L) 150 - 440 K/uL  Basic metabolic panel     Status: Abnormal   Collection Time: 07/05/15  4:41 AM  Result Value  Ref Range   Sodium 140 135 - 145 mmol/L   Potassium 3.6 3.5 - 5.1 mmol/L   Chloride 109 101 - 111 mmol/L   CO2 24 22 - 32 mmol/L   Glucose, Bld 93 65 - 99 mg/dL   BUN 25 (H) 6 - 20 mg/dL   Creatinine, Ser 1.25 (H) 0.61 - 1.24 mg/dL   Calcium 8.0 (L) 8.9 - 10.3 mg/dL   GFR calc non Af Amer 56 (L) >60 mL/min   GFR calc Af Amer >60 >60 mL/min    Comment: (NOTE) The eGFR has been calculated using the CKD EPI equation. This calculation has not been validated in all clinical situations. eGFR's persistently <60 mL/min signify possible Chronic Kidney Disease.    Anion gap 7 5 - 15  Troponin I     Status: Abnormal   Collection Time: 07/05/15 10:22 AM  Result Value Ref Range   Troponin I 0.39 (H) <0.031 ng/mL    Comment: PREVIOUS RESULT CALLED _0  ON 07/04/15 BY [facility].Marland KitchenHKP        PERSISTENTLY INCREASED TROPONIN VALUES IN THE RANGE OF 0.04-0.49 ng/mL CAN BE SEEN IN:       -UNSTABLE ANGINA       -CONGESTIVE HEART FAILURE       -MYOCARDITIS       -CHEST TRAUMA       -ARRYHTHMIAS       -LATE PRESENTING MYOCARDIAL INFARCTION       -COPD   CLINICAL FOLLOW-UP RECOMMENDED.     Dg Chest 2 View  07/04/2015  CLINICAL DATA:  Sepsis.  Shortness of breath. EXAM: CHEST  2 VIEW COMPARISON:  06/29/2015 FINDINGS: Bibasilar airspace opacities likely reflect atelectasis. Heart is normal size. Trace bilateral pleural effusions. No acute bony abnormality. IMPRESSION: Trace bilateral effusions with bibasilar atelectasis. Electronically Signed   By: Rolm Baptise M.D.   On: 07/04/2015 16:34   Dg Abd 1 View  07/05/2015  CLINICAL DATA:  Abdominal pain, bloating EXAM: ABDOMEN - 1 VIEW COMPARISON:  None. FINDINGS: There is normal small bowel gas pattern. Some colonic gas noted in right colon and transverse colon. Minimal colonic gas in descending colon. Mild degenerative changes lumbar spine. No free abdominal air. IMPRESSION: Normal small bowel gas pattern. No free  abdominal air. Colonic gas in proximal colon.  Small amount of colonic gas in distal colon. Electronically Signed   By: Lahoma Crocker M.D.   On: 07/05/2015 14:37   Nm Pulmonary Perf And Vent  07/05/2015  CLINICAL DATA:  Two week history of shortness of breath EXAM: NUCLEAR MEDICINE VENTILATION - PERFUSION LUNG SCAN Views: Anterior, posterior, left lateral, right lateral, RPO, LPO, RAO, LAO -ventilation and perfusion RADIOPHARMACEUTICALS:  87.681 millicurie [id number] DTPA aerosol inhalation and 1.638 millicurie [id number] MAA IV COMPARISON:  Chest radiograph July 04, 2015 FINDINGS: Ventilation: There is slight decreased uptake throughout the right mid and upper lung zones compared to the left side, were uptake is essentially normal. There are no focal segmental or significant subsegmental defects. Perfusion: Radiotracer uptake bilaterally is homogeneous and symmetric. No appreciable perfusion defects are identified. IMPRESSION: No perfusion defects are identified. This study constitutes a very low probability of pulmonary embolus. Relative decreased uptake in portions of the right lung compared to the left raise question of a degree of COPD involving portions of the right lung, primarily in the upper lobe region. Electronically Signed   By: Lowella Grip III M.D.   On: 07/05/2015 16:05    Review of Systems  Constitutional: Positive for malaise/fatigue. Negative for fever, chills, weight loss and diaphoresis.  HENT: Negative.   Eyes: Positive for discharge. Negative for blurred vision, double vision, photophobia, pain and redness.       Left eye drainage. Cultured by Dr. Ola Spurr.  Respiratory: Positive for shortness of breath. Negative for cough, hemoptysis, sputum production and wheezing.   Cardiovascular: Negative.   Gastrointestinal: Negative for heartburn, nausea, vomiting, abdominal pain, diarrhea, constipation, blood in stool and melena.       Decrease appetite after starting antibiotics.   Genitourinary: Negative.    Musculoskeletal: Negative.   Skin: Positive for rash.       Right face and left chin area.  Neurological: Negative.  Negative for weakness.  Psychiatric/Behavioral: Negative.    Blood pressure 124/73, pulse 66, temperature 99.1 F (37.3 C), temperature source Oral, resp. rate 16, height _0  (1.905 m), weight 96.979 kg (213 lb 12.8 oz), SpO2 96 %. Physical Exam  Nursing note and vitals reviewed. Constitutional: He is oriented to person, place, and time. He appears well-developed and well-nourished. No distress.  HENT:  Head: Normocephalic and atraumatic.  Eyes: Right eye exhibits discharge. Left eye exhibits no discharge. No scleral icterus.  Neck: Normal range of motion. Neck supple. No JVD present. No tracheal deviation present. No thyromegaly present.  Cardiovascular: Normal rate, regular rhythm, normal heart sounds and intact distal pulses.  Exam reveals no gallop and no friction rub.   No murmur heard. Respiratory: Effort normal and breath sounds normal. No stridor.  GI: Soft. Bowel sounds are normal. He exhibits no distension and no mass. There is no tenderness.  Lymphadenopathy:    He has no cervical adenopathy.  Neurological: He is alert and oriented to person, place, and time.  Skin: Rash noted. He is not diaphoretic. No erythema. No pallor.  Erythematous rash note right cheek area and left chin.   Psychiatric: He has a normal mood and affect.    Assessment/Plan: Decrease appetite: Appears to related to recent and current antibiotic therapy. Agree with Protonix. Will monitor electrolytes and caloric intake during hospitalization. Recommend that he reschedule outpatient GI follow up appointment.   Patient seen in collaboration with Dr. Verdie Shire.   December Hedtke-FNP-BC 07/05/2015, 6:30 PM

## 2015-07-05 NOTE — Progress Notes (Signed)
Initial Nutrition Assessment    INTERVENTION:  Meals and snacks: Cater to pt preferences Medical Nutrition Supplement Therapy: Agree with adding ensure QID to provide additional kcals and protein   NUTRITION DIAGNOSIS:   Inadequate oral intake related to acute illness as evidenced by per patient/family report.    GOAL:   Patient will meet greater than or equal to 90% of their needs    MONITOR:    (Energy intake, )  REASON FOR ASSESSMENT:   Malnutrition Screening Tool    ASSESSMENT:      Pt admitted with rash on face, not eating, shortness of breath, UTI  Past Medical History  Diagnosis Date  . Hypertension   . BPH (benign prostatic hyperplasia)   . CHF (congestive heart failure) ([facility])   . MRSA bacteremia   . CAD (coronary artery disease)   . MI, old     Current Nutrition: ate few bites of lunch, likes ensure  Food/Nutrition-Related History: reports for the past month no appetite at all. Previously no problem with appetite   Scheduled Medications:  . aspirin EC  325 mg Oral Daily  . cefTRIAXone (ROCEPHIN)  IV  1 g Intravenous Q24H  . cholestyramine light  4 g Oral Daily  . feeding supplement (ENSURE ENLIVE)  237 mL Oral TID WC & HS  . losartan  100 mg Oral Daily  . metoprolol succinate  50 mg Oral Daily  . multivitamin with minerals  1 tablet Oral Daily  . pantoprazole  40 mg Oral Daily  . simvastatin  40 mg Oral QHS  . sodium chloride flush  3 mL Intravenous Q12H  . sodium chloride flush  3 mL Intravenous Q12H  . tamsulosin  0.4 mg Oral Daily  . terazosin  2 mg Oral QHS  . valACYclovir  1,000 mg Oral TID  . vancomycin  1,000 mg Intravenous Once  . vancomycin  1,000 mg Intravenous Once  . [START ON 07/06/2015] vancomycin  1,000 mg Intravenous Q12H     Electrolyte/Renal Profile and Glucose Profile:   Recent Labs Lab 07/03/15 0529 07/04/15 1615 07/05/15 0441  NA 142 137 140  K 3.9 3.7 3.6  CL 117* 111 109  CO2 19* 22 24  BUN 27* 23* 25*   CREATININE 1.15 1.11 1.25*  CALCIUM 8.3* 8.2* 8.0*  GLUCOSE 106* 109* 93   Protein Profile:  Recent Labs Lab 06/29/15 0301 07/04/15 1615  ALBUMIN 3.9 2.9*    Gastrointestinal Profile: Last BM: 1/24   Nutrition-Focused Physical Exam Findings:  Unable to complete Nutrition-Focused physical exam at this time.  MD came into room and deferred at this time   Weight Change: 2% wt loss in the last 4 weeks per pt report    Diet Order:  Diet 2 gram sodium Room service appropriate?: Yes; Fluid consistency:: Thin  Skin:   reviewed    Height:   Ht Readings from Last 1 Encounters:  07/04/15  (1.905 m)    Weight:   Wt Readings from Last 1 Encounters:  07/04/15 213 lb 12.8 oz (96.979 kg)    Ideal Body Weight:     BMI:  Body mass index is 26.72 kg/(m^2).  Estimated Nutritional Needs:   Kcal:  BEE 1810 kcals (IF 1.0-1.2, AF 1.3) 4098-1191 kcals/d.   Protein:  (1.0-1.2 g/kg) 97-116 g/d  Fluid:  (25-21ml/kg) 2425-2967ml/d  EDUCATION NEEDS:   No education needs identified at this time  MODERATE Care Level   Jovanny Stephanie B. Freida Busman, RD, LDN [phone] (pager) Weekend/On-Call  pager [phone])

## 2015-07-06 ENCOUNTER — Ambulatory Visit: Payer: Self-pay | Admitting: Urology

## 2015-07-06 LAB — COMPREHENSIVE METABOLIC PANEL
ALBUMIN: 2.7 g/dL — AB (ref 3.5–5.0)
ALT: 37 U/L (ref 17–63)
AST: 32 U/L (ref 15–41)
Alkaline Phosphatase: 106 U/L (ref 38–126)
Anion gap: 9 (ref 5–15)
BILIRUBIN TOTAL: 0.5 mg/dL (ref 0.3–1.2)
BUN: 21 mg/dL — AB (ref 6–20)
CHLORIDE: 109 mmol/L (ref 101–111)
CO2: 18 mmol/L — ABNORMAL LOW (ref 22–32)
CREATININE: 1.16 mg/dL (ref 0.61–1.24)
Calcium: 8.2 mg/dL — ABNORMAL LOW (ref 8.9–10.3)
GFR calc Af Amer: >60 mL/min (ref >60)
GLUCOSE: 110 mg/dL — AB (ref 65–99)
POTASSIUM: 3.8 mmol/L (ref 3.5–5.1)
Sodium: 136 mmol/L (ref 135–145)
TOTAL PROTEIN: 6.6 g/dL (ref 6.5–8.1)

## 2015-07-06 LAB — URINE CULTURE: Culture: NO GROWTH

## 2015-07-06 LAB — CREATININE, SERUM
Creatinine, Ser: 1.16 mg/dL (ref 0.61–1.24)
GFR calc Af Amer: >60 mL/min (ref >60)

## 2015-07-06 MED ORDER — CEPHALEXIN 500 MG PO CAPS
500.0000 mg | ORAL_CAPSULE | Freq: Three times a day (TID) | ORAL | Status: DC
  Administered 2015-07-06 – 2015-07-07 (×4): 500 mg via ORAL
  Filled 2015-07-06 (×4): qty 1

## 2015-07-06 MED ORDER — POTASSIUM CHLORIDE CRYS ER 20 MEQ PO TBCR
20.0000 meq | EXTENDED_RELEASE_TABLET | Freq: Every day | ORAL | Status: DC
  Administered 2015-07-06 – 2015-07-07 (×2): 20 meq via ORAL
  Filled 2015-07-06 (×2): qty 1

## 2015-07-06 MED ORDER — FUROSEMIDE 20 MG PO TABS
20.0000 mg | ORAL_TABLET | Freq: Every day | ORAL | Status: DC
  Administered 2015-07-06 – 2015-07-07 (×2): 20 mg via ORAL
  Filled 2015-07-06 (×2): qty 1

## 2015-07-06 NOTE — Care Management Note (Signed)
Received notification from Scherrie Bateman that wife had questions regarding Medicare observation status. TC to wife. Explained that patient did not meet criteria for inpatient admission but he did for observation. Read the observation letter to her explaining how the hospital stay was covered. She had questions regarding her deductible and copays. Referred her to the billing office and her insurance company. Offered to speak with her at any time if she has further questions. Case discussed with Dr. Winona Legato. It is anticipated that patient will be discharged home in the next 24 hours with foley catheter and follow up with urologist.

## 2015-07-06 NOTE — Progress Notes (Signed)
[facility] [place] [facility]   PATIENT NAME: Casey Murphy    MR#:  [id number]  DATE OF BIRTH:  06/22/1945  SUBJECTIVE:  CHIEF COMPLAINT:   Chief Complaint  Patient presents with  . Rash  . Shortness of Breath   patient is 70 year old African-American American male was past medical history of recent admission for Escherichia coli sepsis and discharged on Keflex comes back to the hospital with complaints of worsening rash on his face, nausea, shortness of breath. He was diagnosed with shingles and admitted to the hospital for acyclovir. Because of concern of secondary to infection versus erysipelas. Patient was initiated on buying vancomycin as well. Infectious disease specialist, Dr. Sampson Goon, also patient as well as cardiologist and gastroenterologist, recommended to continue the same and follow-up with gastroenterology and cardiology as outpatient. Patient feels better today, denies any eye pain or drainage. Afebrile. Wound cultures are pending. Urine cultures are negative  Review of Systems  Constitutional: Negative for fever, chills and weight loss.  HENT: Negative for congestion.   Eyes: Negative for blurred vision and double vision.  Respiratory: Positive for shortness of breath. Negative for cough, sputum production and wheezing.   Cardiovascular: Negative for chest pain, palpitations, orthopnea, leg swelling and PND.  Gastrointestinal: Positive for nausea. Negative for vomiting, abdominal pain, diarrhea, constipation and blood in stool.  Genitourinary: Negative for dysuria, urgency, frequency and hematuria.  Musculoskeletal: Negative for falls.  Skin: Positive for rash.  Neurological: Negative for dizziness, tremors, focal weakness and headaches.  Endo/Heme/Allergies: Does not bruise/bleed easily.  Psychiatric/Behavioral: Negative for depression. The patient does not have insomnia.     VITAL SIGNS: Blood pressure 136/77, pulse 70, temperature  99.3 F (37.4 C), temperature source Oral, resp. rate 18, height  (1.905 m), weight 96.979 kg (213 lb 12.8 oz), SpO2 98 %.  PHYSICAL EXAMINATION:   GENERAL:  70 y.o.-year-old patient lying in the bed with no acute distress.  EYES: Pupils equal, round, reactive to light and accommodation. Right eye conjunctival injection was noted to some purulent discharge. Rash was noted, scaling on the face on the right side cheek and the left side of chin , no vesicles   were noted.  No scleral icterus. Extraocular muscles intact.  HEENT: Head atraumatic, normocephalic. Oropharynx and nasopharynx clear.  NECK:  Supple, no jugular venous distention. No thyroid enlargement, no tenderness.  LUNGS: Normal breath sounds bilaterally, no wheezing, rales,rhonchi or crepitation. No use of accessory muscles of respiration.  CARDIOVASCULAR: S1, S2 normal. No murmurs, rubs, or gallops.  ABDOMEN: Soft, nontender, nondistended. Bowel sounds present. No organomegaly or mass.  EXTREMITIES: No pedal edema, cyanosis, or clubbing.  NEUROLOGIC: Cranial nerves II through XII are intact. Muscle strength 5/5 in all extremities. Sensation intact. Gait not checked.  PSYCHIATRIC: The patient is alert and oriented x 3.  SKIN: No obvious rash, lesion, or ulcer. Foley catheter is present and drains clear urine  ORDERS/RESULTS REVIEWED:   CBC  Recent Labs Lab 07/01/15 0548 07/02/15 0708 07/03/15 0529 07/04/15 1615 07/05/15 0441  WBC 26.4* 24.0* 20.6* 19.5* 15.7*  HGB 13.8 13.6 13.9 14.8 14.4  HCT 42.8 41.2 41.6 44.9 43.2  PLT 75* 86* 76* 92* 99*  MCV 87.6 87.7 86.4 85.5 86.6  MCH 28.3 28.9 29.0 28.3 28.9  MCHC 32.3 33.0 33.5 33.1 33.4  RDW 15.1* 15.0* 14.7* 14.8* 15.1*  LYMPHSABS  --   --   --  3.5  --   MONOABS  --   --   --  1.4*  --   EOSABS  --   --   --  0.0  --   BASOSABS  --   --   --  0.0  --     ------------------------------------------------------------------------------------------------------------------  Chemistries   Recent Labs Lab 07/01/15 0548 07/02/15 0708 07/03/15 0529 07/04/15 1615 07/05/15 0441 07/06/15 0412  NA 142 140 142 137 140  --   K 3.9 3.8 3.9 3.7 3.6  --   CL 115* 116* 117* 111 109  --   CO2 17* 19* 19* 22 24  --   GLUCOSE 78 92 106* 109* 93  --   BUN 41* 29* 27* 23* 25*  --   CREATININE 1.71* 1.10 1.15 1.11 1.25* 1.16  CALCIUM 7.6* 7.7* 8.3* 8.2* 8.0*  --   AST  --   --   --  52*  --   --   ALT  --   --   --  49  --   --   ALKPHOS  --   --   --  166*  --   --   BILITOT  --   --   --  0.9  --   --    ------------------------------------------------------------------------------------------------------------------ estimated creatinine clearance is 69.8 mL/min (by C-G formula based on Cr of 1.16). ------------------------------------------------------------------------------------------------------------------ No results for input(s): TSH, T4TOTAL, T3FREE, THYROIDAB in the last 72 hours.  Invalid input(s): FREET3  Cardiac Enzymes  Recent Labs Lab 07/04/15 2101 07/05/15 0441 07/05/15 1022  TROPONINI 0.55* 0.54* 0.39*   ------------------------------------------------------------------------------------------------------------------ Invalid input(s): POCBNP ---------------------------------------------------------------------------------------------------------------  RADIOLOGY: Dg Chest 2 View  07/04/2015  CLINICAL DATA:  Sepsis.  Shortness of breath. EXAM: CHEST  2 VIEW COMPARISON:  06/29/2015 FINDINGS: Bibasilar airspace opacities likely reflect atelectasis. Heart is normal size. Trace bilateral pleural effusions. No acute bony abnormality. IMPRESSION: Trace bilateral effusions with bibasilar atelectasis. Electronically Signed   By: Charlett Nose M.D.   On: 07/04/2015 16:34   Dg Abd 1 View  07/05/2015  CLINICAL DATA:  Abdominal pain,  bloating EXAM: ABDOMEN - 1 VIEW COMPARISON:  None. FINDINGS: There is normal small bowel gas pattern. Some colonic gas noted in right colon and transverse colon. Minimal colonic gas in descending colon. Mild degenerative changes lumbar spine. No free abdominal air. IMPRESSION: Normal small bowel gas pattern. No free abdominal air. Colonic gas in proximal colon. Small amount of colonic gas in distal colon. Electronically Signed   By: Natasha Mead M.D.   On: 07/05/2015 14:37   Nm Pulmonary Perf And Vent  07/05/2015  CLINICAL DATA:  Two week history of shortness of breath EXAM: NUCLEAR MEDICINE VENTILATION - PERFUSION LUNG SCAN Views: Anterior, posterior, left lateral, right lateral, RPO, LPO, RAO, LAO -ventilation and perfusion RADIOPHARMACEUTICALS:  31.611 millicurie Technetium-63m DTPA aerosol inhalation and 4.215 millicurie Technetium-60m MAA IV COMPARISON:  Chest radiograph July 04, 2015 FINDINGS: Ventilation: There is slight decreased uptake throughout the right mid and upper lung zones compared to the left side, were uptake is essentially normal. There are no focal segmental or significant subsegmental defects. Perfusion: Radiotracer uptake bilaterally is homogeneous and symmetric. No appreciable perfusion defects are identified. IMPRESSION: No perfusion defects are identified. This study constitutes a very low probability of pulmonary embolus. Relative decreased uptake in portions of the right lung compared to the left raise question of a degree of COPD involving portions of the right lung, primarily in the upper lobe region. Electronically Signed   By: Bretta Bang III M.D.   On: 07/05/2015 16:05  EKG:  Orders placed or performed during the hospital encounter of 07/04/15  . ED EKG  . ED EKG  . EKG 12-Lead  . EKG 12-Lead    ASSESSMENT AND PLAN:  Active Problems:   SOB (shortness of breath)  #1. Dyspnea, very likely due to some element of fluid overload in view of history of  cardiomyopathy with ejection fraction of around 25%, patient would benefit from Lasix while he is on IV antibiotics in the hospital, watching his kidney function closely, start patient on low dose Lasix. vQ scan revealed no perfusion defects, low probability scan #2. Elevated troponin, likely demand ischemia, appreciate cardiologist input, . Follow up with cardiologist as outpatient, continue Cozaar, Toprol, aspirin #3. Renal insufficiency, follow with diuresis, May need to discontinue diuresis. If kidney function worsens #4. Leukocytosis, improving with Rocephin, follow in the morning #5. Thrombocytopenia, seemed to be better since discharge, HIT is pending, off  Lovenox. Initiated patient on sequential compression #6. Nausea with poor by mouth intake, KUB showed no ileus, continue patient on Protonix, appreciate gastroenterologist input, follow-up as outpatient for EGD if oral intake does not improve. Oral intake has improved today #7 Rash, differential diagnosis is shingles versus erysipelas, continue vancomycin, acyclovir, awaiting for varicella-zoster virus and wound cultures,appreciate Dr. Jarrett Ables input  #8. 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  #9. Generalized weakness, initiate physical therapy. Likely discharge home with home health physical therapy  Management plans discussed with the patient, family and they are in agreement.   DRUG ALLERGIES:  Allergies  Allergen Reactions  . Bactrim [Sulfamethoxazole-Trimethoprim] Hives and Swelling  . Lisinopril Swelling  . Sulfa Antibiotics Hives and  Swelling    CODE STATUS:     Code Status Orders        Start     Ordered   07/04/15 1809  Full code   Continuous     07/04/15 1809    Code Status History    Date Active Date Inactive Code Status Order ID Comments User Context   06/29/2015  6:02 AM 07/03/2015  9:32 PM Full Code [id number]  Ihor Austin, MD ED      TOTAL TIME TAKING CARE OF THIS PATIENT: 40 minutes.   Katharina Caper M.D on 07/06/2015 at 2:52 PM  Between 7am to 6pm - Pager - [phone]  After 6pm go to [email or web address] - password [email or web address] [facility]  [facility]  Office  [phone]  CC: Primary care physician; Quita Skye, MD

## 2015-07-06 NOTE — Evaluation (Signed)
Physical Therapy Evaluation Patient Details Name: Decklan Mau MRN: [id number] DOB: 10/11/1945 Today's Date: 07/06/2015   History of Present Illness  Pt admitted for complaints of SOB. Pt with recent hospitalization for Ecoli and sepsis. Pt with history of HTN, CHF and MRSA. Pt previously independent. Elevated troponin noted, however noted to be demand ischemia.   Clinical Impression  Pt is a pleasant 70 year old male who was admitted for complaints of SOB. Pt demonstrates all bed mobility/transfers/ambulation at baseline level and does not need AD. All mobility performed on room air with sats WNL. Pt does not require any further PT needs at this time. Pt will be dc in house and does not require follow up. RN aware. Will dc current orders.     Follow Up Recommendations No PT follow up    Equipment Recommendations  None recommended by PT    Recommendations for Other Services       Precautions / Restrictions Precautions Precautions: Fall Restrictions Weight Bearing Restrictions: No      Mobility  Bed Mobility               General bed mobility comments: not assessed as pt received in bed  Transfers Overall transfer level: Independent Equipment used: None             General transfer comment: safe technique performed. Pt performed 5 time Sit<>stand x 32 seconds.  Ambulation/Gait Ambulation/Gait assistance: Independent Ambulation Distance (Feet): 150 Feet Assistive device: None Gait Pattern/deviations: WFL(Within Functional Limits)     General Gait Details: slow steady gait pattern without AD used. Pt with reciprocal gait pattern with O2 sats 94% pre and 93% post while on room air. Pt with no complaints of SOB.   Stairs            Wheelchair Mobility    Modified Rankin (Stroke Patients Only)       Balance Overall balance assessment: Independent                                           Pertinent Vitals/Pain Pain Assessment:  No/denies pain    Home Living Family/patient expects to be discharged to:: Private residence Living Arrangements: Spouse/significant other Available Help at Discharge: Family Type of Home: House Home Access: Level entry     Home Layout: Multi-level;Able to live on main level with bedroom/bathroom Home Equipment: None      Prior Function Level of Independence: Independent               Hand Dominance        Extremity/Trunk Assessment   Upper Extremity Assessment: Overall WFL for tasks assessed           Lower Extremity Assessment: Overall WFL for tasks assessed         Communication   Communication: No difficulties  Cognition Arousal/Alertness: Awake/alert Behavior During Therapy: WFL for tasks assessed/performed Overall Cognitive Status: Within Functional Limits for tasks assessed                      General Comments      Exercises        Assessment/Plan    PT Assessment Patent does not need any further PT services  PT Diagnosis     PT Problem List    PT Treatment Interventions     PT Goals (Current goals can  be found in the Care Plan section) Acute Rehab PT Goals Patient Stated Goal: return to home soon  PT Goal Formulation: All assessment and education complete, DC therapy Time For Goal Achievement: 07/06/15 Potential to Achieve Goals: Good    Frequency     Barriers to discharge        Co-evaluation               End of Session Equipment Utilized During Treatment: Gait belt Activity Tolerance: Patient tolerated treatment well;No increased pain Patient left: in chair;with chair alarm set;with call bell/phone within reach Nurse Communication: Mobility status    Functional Assessment Tool Used: 5 time sit<>Stand Functional Limitation: Mobility: Walking and moving around Mobility: Walking and Moving Around Current Status [phone]): 0 percent impaired, limited or restricted Mobility: Walking and Moving Around Goal Status  [phone]): 0 percent impaired, limited or restricted Mobility: Walking and Moving Around Discharge Status [phone]): 0 percent impaired, limited or restricted    Time: 9147-8295 PT Time Calculation (min) (ACUTE ONLY): 11 min   Charges:   PT Evaluation $PT Eval Low Complexity: 1 Procedure     PT G Codes:   PT G-Codes **NOT FOR INPATIENT CLASS** Functional Assessment Tool Used: 5 time sit<>Stand Functional Limitation: Mobility: Walking and moving around Mobility: Walking and Moving Around Current Status (A2130): 0 percent impaired, limited or restricted Mobility: Walking and Moving Around Goal Status (Q6578): 0 percent impaired, limited or restricted Mobility: Walking and Moving Around Discharge Status [phone]): 0 percent impaired, limited or restricted    Venie Montesinos 07/06/2015, 5:07 PM  Elizabeth Palau, PT, DPT [phone]

## 2015-07-06 NOTE — Progress Notes (Signed)
Nutrition Follow-up   INTERVENTION:    Meals and Snacks: Cater to patient preferences Medical Food Supplement Therapy: Continue Ensure as ordered Coordination of Care: Calorie count order placed per consult, envelope posted in pt's room to collect tray tickets and RN [place] made aware. Will calculate on follow-up.   NUTRITION DIAGNOSIS:   Inadequate oral intake related to acute illness as evidenced by per patient/family report.  GOAL:   Patient will meet greater than or equal to 90% of their needs; ongoing  MONITOR:    (Energy intake, )  REASON FOR ASSESSMENT:   Consult Calorie Count  ASSESSMENT:    Per GI MD note, pt was d/c from [facility] on 07/03/2015 after treatment for sepsis and UTI and readmitted with shingles; decreased appetite likely from infections.   Diet Order:  Diet 2 gram sodium Room service appropriate?: Yes; Fluid consistency:: Thin    Current Nutrition: Pt reports eating 100% of french toast this am, bites of eggs, and all of his coffee and orange juice. Pt reports drinking an ensure last night and then again around 3am.   Gastrointestinal Profile: Last BM: 07/05/2015   Scheduled Medications:  . aspirin EC  325 mg Oral Daily  . cefTRIAXone (ROCEPHIN)  IV  1 g Intravenous Q24H  . cholestyramine light  4 g Oral Daily  . feeding supplement (ENSURE ENLIVE)  237 mL Oral TID WC & HS  . losartan  100 mg Oral Daily  . metoprolol succinate  50 mg Oral Daily  . multivitamin with minerals  1 tablet Oral Daily  . pantoprazole  40 mg Oral Daily  . simvastatin  40 mg Oral QHS  . sodium chloride flush  3 mL Intravenous Q12H  . sodium chloride flush  3 mL Intravenous Q12H  . tamsulosin  0.4 mg Oral Daily  . terazosin  2 mg Oral QHS  . valACYclovir  1,000 mg Oral TID  . vancomycin  1,000 mg Intravenous Q12H    Electrolyte/Renal Profile and Glucose Profile:   Recent Labs Lab 07/03/15 0529 07/04/15 1615 07/05/15 0441 07/06/15 0412  NA 142 137 140  --   K  3.9 3.7 3.6  --   CL 117* 111 109  --   CO2 19* 22 24  --   BUN 27* 23* 25*  --   CREATININE 1.15 1.11 1.25* 1.16  CALCIUM 8.3* 8.2* 8.0*  --   GLUCOSE 106* 109* 93  --    Protein Profile:  Recent Labs Lab 07/04/15 1615  ALBUMIN 2.9*    Nutrition-Focused Physical Exam: Nutrition-Focused physical exam completed. Findings are WDL for fat depletion, muscle depletion, and edema.    Weight Trend since Admission: Filed Weights   07/04/15 1604 07/04/15 2047  Weight: 220 lb (99.791 kg) 213 lb 12.8 oz (96.979 kg)    BMI:  Body mass index is 26.72 kg/(m^2).  Estimated Nutritional Needs:   Kcal:  BEE 1810 kcals (IF 1.0-1.2, AF 1.3) 1610-9604 kcals/d.   Protein:  (1.0-1.2 g/kg) 97-116 g/d  Fluid:  (25-56ml/kg) 2425-2947ml/d  EDUCATION NEEDS:   No education needs identified at this time   MODERATE Care Level  Leda Quail, RD, LDN Pager [phone] Weekend/On-Call Pager [phone]

## 2015-07-07 DIAGNOSIS — R0600 Dyspnea, unspecified: Secondary | ICD-10-CM

## 2015-07-07 DIAGNOSIS — R7989 Other specified abnormal findings of blood chemistry: Secondary | ICD-10-CM

## 2015-07-07 DIAGNOSIS — R778 Other specified abnormalities of plasma proteins: Secondary | ICD-10-CM

## 2015-07-07 DIAGNOSIS — D696 Thrombocytopenia, unspecified: Secondary | ICD-10-CM

## 2015-07-07 DIAGNOSIS — N289 Disorder of kidney and ureter, unspecified: Secondary | ICD-10-CM

## 2015-07-07 DIAGNOSIS — B029 Zoster without complications: Secondary | ICD-10-CM

## 2015-07-07 DIAGNOSIS — L03211 Cellulitis of face: Secondary | ICD-10-CM

## 2015-07-07 DIAGNOSIS — I5022 Chronic systolic (congestive) heart failure: Secondary | ICD-10-CM

## 2015-07-07 DIAGNOSIS — D72829 Elevated white blood cell count, unspecified: Secondary | ICD-10-CM

## 2015-07-07 LAB — CBC
HCT: 43.5 % (ref 40.0–52.0)
HEMOGLOBIN: 14.4 g/dL (ref 13.0–18.0)
MCH: 28.4 pg (ref 26.0–34.0)
MCHC: 33.2 g/dL (ref 32.0–36.0)
MCV: 85.7 fL (ref 80.0–100.0)
PLATELETS: 190 10*3/uL (ref 150–440)
RBC: 5.07 MIL/uL (ref 4.40–5.90)
RDW: 14.8 % — ABNORMAL HIGH (ref 11.5–14.5)
WBC: 13.2 10*3/uL — AB (ref 3.8–10.6)

## 2015-07-07 LAB — CREATININE, SERUM
CREATININE: 1.1 mg/dL (ref 0.61–1.24)
GFR calc Af Amer: >60 mL/min (ref >60)
GFR calc non Af Amer: >60 mL/min (ref >60)

## 2015-07-07 MED ORDER — DOXYCYCLINE HYCLATE 100 MG PO CAPS: 100.0000 mg | ORAL_CAPSULE | Freq: Two times a day (BID) | ORAL | Status: DC

## 2015-07-07 MED ORDER — MUPIROCIN 2 % EX OINT
TOPICAL_OINTMENT | Freq: Two times a day (BID) | CUTANEOUS | Status: DC
  Filled 2015-07-07: qty 22

## 2015-07-07 MED ORDER — MUPIROCIN CALCIUM 2 % EX CREA
TOPICAL_CREAM | Freq: Two times a day (BID) | CUTANEOUS | Status: DC
  Filled 2015-07-07 (×2): qty 15

## 2015-07-07 MED ORDER — MUPIROCIN CALCIUM 2 % EX CREA: 1.0000 "application " | TOPICAL_CREAM | Freq: Two times a day (BID) | CUTANEOUS | Status: DC

## 2015-07-07 MED ORDER — VALACYCLOVIR HCL 1 G PO TABS: 1000.0000 mg | ORAL_TABLET | Freq: Three times a day (TID) | ORAL | Status: DC

## 2015-07-07 NOTE — Care Management Note (Signed)
Case Management Note  Patient Details  Name: Casey Murphy MRN: [id number] Date of Birth: Apr 04, 1946  Subjective/Objective:  Observation patient. Case discussed with Dr. Winona Legato It is anticipated that patient will discharge today following recommendations from Dr. Sampson Goon.                  Action/Plan:   Expected Discharge Date:                  Expected Discharge Plan:     In-House Referral:     Discharge planning Services  CM Consult  Post Acute Care Choice:    Choice offered to:     DME Arranged:    DME Agency:     HH Arranged:    HH Agency:     Status of Service:  In process, will continue to follow  Medicare Important Message Given:    Date Medicare IM Given:    Medicare IM give by:    Date Additional Medicare IM Given:    Additional Medicare Important Message give by:     If discussed at Long Length of Stay Meetings, dates discussed:    Additional Comments:  Marily Memos, RN 07/07/2015, 2:30 PM

## 2015-07-07 NOTE — Progress Notes (Signed)
Pt alert and oriented x4, no complaints of pain or discomfort.  Bed in low position, call bell within reach.  Bed alarms on and functioning.  Assessment done and charted.  Will continue to monitor and do hourly rounding throughout the shift 

## 2015-07-07 NOTE — Progress Notes (Signed)
[facility] CLINIC INFECTIOUS DISEASE PROGRESS NOTE Date of Admission:  07/04/2015     ID: Casey Murphy is a 70 y.o. male with facial cellulitis and possible zoster  Active Problems:   SOB (shortness of breath)   Facial cellulitis   Shingles rash   Dyspnea   Elevated troponin   Renal insufficiency   Thrombocytopenia (HCC)   Chronic systolic CHF (congestive heart failure) (HCC)   Leukocytosis   Subjective: No fevers  ROS  Eleven systems are reviewed and negative except per hpi  Medications:  Antibiotics Given (last 72 hours)    Date/Time Action Medication Dose Rate   07/04/15 1906 Given   valACYclovir (VALTREX) tablet 1,000 mg 1,000 mg    07/04/15 2126 Given   valACYclovir (VALTREX) tablet 1,000 mg 1,000 mg    07/05/15 7829 Given   valACYclovir (VALTREX) tablet 1,000 mg 1,000 mg    07/05/15 1611 Given   vancomycin (VANCOCIN) IVPB 1000 mg/200 mL premix 1,000 mg 200 mL/hr   07/05/15 1612 Given   valACYclovir (VALTREX) tablet 1,000 mg 1,000 mg    07/05/15 2040 Given   valACYclovir (VALTREX) tablet 1,000 mg 1,000 mg    07/05/15 2041 Given   vancomycin (VANCOCIN) IVPB 1000 mg/200 mL premix 1,000 mg 200 mL/hr   07/06/15 0932 Given   valACYclovir (VALTREX) tablet 1,000 mg 1,000 mg    07/06/15 0933 Given   vancomycin (VANCOCIN) IVPB 1000 mg/200 mL premix 1,000 mg 200 mL/hr   07/06/15 1514 Given   valACYclovir (VALTREX) tablet 1,000 mg 1,000 mg    07/06/15 1514 Given   cephALEXin (KEFLEX) capsule 500 mg 500 mg    07/06/15 2246 Given   valACYclovir (VALTREX) tablet 1,000 mg 1,000 mg    07/06/15 2246 Given   vancomycin (VANCOCIN) IVPB 1000 mg/200 mL premix 1,000 mg 200 mL/hr   07/06/15 2246 Given   cephALEXin (KEFLEX) capsule 500 mg 500 mg    07/07/15 0551 Given   cephALEXin (KEFLEX) capsule 500 mg 500 mg    07/07/15 1012 Given   valACYclovir (VALTREX) tablet 1,000 mg 1,000 mg    07/07/15 1151 Given  [had to call pharmacy for the medication]   vancomycin (VANCOCIN) IVPB  1000 mg/200 mL premix 1,000 mg 200 mL/hr   07/07/15 1443 Given   cephALEXin (KEFLEX) capsule 500 mg 500 mg      . aspirin EC  325 mg Oral Daily  . cephALEXin  500 mg Oral 3 times per day  . cholestyramine light  4 g Oral Daily  . feeding supplement (ENSURE ENLIVE)  237 mL Oral TID WC & HS  . furosemide  20 mg Oral Daily  . losartan  100 mg Oral Daily  . metoprolol succinate  50 mg Oral Daily  . multivitamin with minerals  1 tablet Oral Daily  . pantoprazole  40 mg Oral Daily  . potassium chloride  20 mEq Oral Daily  . simvastatin  40 mg Oral QHS  . sodium chloride flush  3 mL Intravenous Q12H  . sodium chloride flush  3 mL Intravenous Q12H  . tamsulosin  0.4 mg Oral Daily  . terazosin  2 mg Oral QHS  . valACYclovir  1,000 mg Oral TID  . vancomycin  1,000 mg Intravenous Q12H    Objective: Vital signs in last 24 hours: Temp:  [97.9 F (36.6 C)-98.2 F (36.8 C)] 98 F (36.7 C) (01/27 1221) Pulse Rate:  [72-75] 74 (01/27 1221) Resp:  [18-22] 18 (01/27 1221) BP: (119-140)/(49-74) 136/74 mmHg (  01/27 1221) SpO2:  [97 %-100 %] 100 % (01/27 1221)  GENERAL: 70 y.o.-year-old patient lying in the bed with no acute distress.  EYES: Pupils equal, round, reactive to light and accommodation. Right eye conjunctival injection was noted with some purulent discharge. Rash was noted, dry scaling on the face on the right side cheek and the left side of chin , no vesiclesnoted. No scleral icterus. Extraocular muscles intact.  HEENT: Head atraumatic, normocephalic. Oropharynx and nasopharynx clear.  NECK: Supple, no jugular venous distention. No thyroid enlargement, no tenderness.  LUNGS: Normal breath sounds bilaterally, no wheezing, rales,rhonchi or crepitation. No use of accessory muscles of respiration.  CARDIOVASCULAR: S1, S2 normal. No murmurs, rubs, or gallops.  ABDOMEN: Soft, nontender, nondistended. Bowel sounds present. No organomegaly or mass.  EXTREMITIES: No pedal edema,  cyanosis, or clubbing.  NEUROLOGIC: Cranial nerves II through XII are intact. Muscle strength 5/5 in all extremities. Sensation intact. Gait not checked.  PSYCHIATRIC: The patient is alert and oriented x 3.  SKIN: facial rash as above GU Foley catheter is present and drains clear urine  Lab Results  Recent Labs  07/05/15 0441  07/06/15 1935 07/07/15 0802  WBC 15.7*  --   --  13.2*  HGB 14.4  --   --  14.4  HCT 43.2  --   --  43.5  NA 140  --  136  --   K 3.6  --  3.8  --   CL 109  --  109  --   CO2 24  --  18*  --   BUN 25*  --  21*  --   CREATININE 1.25*  < > 1.16 1.10  < > = values in this interval not displayed.  Microbiology: Results for orders placed or performed during the hospital encounter of 07/04/15  Urine culture     Status: None   Collection Time: 07/04/15  8:24 PM  Result Value Ref Range Status   Specimen Description URINE, RANDOM  Final   Special Requests NONE  Final   Culture NO GROWTH 2 DAYS  Final   Report Status 07/06/2015 FINAL  Final  Wound culture     Status: None (Preliminary result)   Collection Time: 07/05/15  4:08 PM  Result Value Ref Range Status   Specimen Description EYE  Final   Special Requests NONE  Final   Gram Stain PENDING  Incomplete   Culture NORMAL SKIN FLORA  Final   Report Status PENDING  Incomplete    Studies/Results: Nm Pulmonary Perf And Vent  07/05/2015  CLINICAL DATA:  Two week history of shortness of breath EXAM: NUCLEAR MEDICINE VENTILATION - PERFUSION LUNG SCAN Views: Anterior, posterior, left lateral, right lateral, RPO, LPO, RAO, LAO -ventilation and perfusion RADIOPHARMACEUTICALS:  31.611 millicurie Technetium-71m DTPA aerosol inhalation and 4.215 millicurie Technetium-84m MAA IV COMPARISON:  Chest radiograph July 04, 2015 FINDINGS: Ventilation: There is slight decreased uptake throughout the right mid and upper lung zones compared to the left side, were uptake is essentially normal. There are no focal segmental or  significant subsegmental defects. Perfusion: Radiotracer uptake bilaterally is homogeneous and symmetric. No appreciable perfusion defects are identified. IMPRESSION: No perfusion defects are identified. This study constitutes a very low probability of pulmonary embolus. Relative decreased uptake in portions of the right lung compared to the left raise question of a degree of COPD involving portions of the right lung, primarily in the upper lobe region. Electronically Signed   By: Bretta Bang III  M.D.   On: 07/05/2015 16:05    Assessment/Plan: Verlie Hellenbrand is a 70 y.o. male with recent admit for UTI with foley cath in place readmitted with sob and R facial rash and redness. I do think it is likely zoster which has become superinfected. I have swabbed for both vzv and routine culture. No vision changes, does not extend to forehead Wound cx with nml skin flora. PCR for VZV pending  Recommendations Dc vanco Can dc on keflex for the Pyelonephritis until 2/2 Cont valtrex for 5 days total for the possible shingels - I can fu on vzv pcr Doxy 1-- bid for 4 more days for the cellulitis Add mupurocin ointment as well for 5 days. Thank you very much for the consult. Will follow with you.  Chrysa Rampy   07/07/2015, 3:00 PM

## 2015-07-08 NOTE — Discharge Summary (Signed)
[facility] - [place] at [facility]   PATIENT NAME: Casey Murphy    MR#:  [id number]  DATE OF BIRTH:  [birth year]  DATE OF ADMISSION:  07/04/2015 ADMITTING PHYSICIAN: Auburn Bilberry, MD  DATE OF DISCHARGE: 07/07/2015  4:35 PM  PRIMARY CARE PHYSICIAN: Quita Skye, MD     ADMISSION DIAGNOSIS:  Shingles [B02.9] SOB (shortness of breath) [R06.02] NSTEMI (non-ST elevated myocardial infarction) (HCC) [I21.4]  DISCHARGE DIAGNOSIS:  Active Problems:   SOB (shortness of breath)   Facial cellulitis   Shingles rash   Dyspnea   Chronic systolic CHF (congestive heart failure) (HCC)   Elevated troponin   Renal insufficiency   Thrombocytopenia (HCC)   Leukocytosis   SECONDARY DIAGNOSIS:   Past Medical History  Diagnosis Date  . Hypertension   . BPH (benign prostatic hyperplasia)   . CHF (congestive heart failure) (HCC)   . MRSA bacteremia   . CAD (coronary artery disease)   . MI, old     .pro HOSPITAL COURSE:  The patient is 70 year old African-American American male was past medical history of recent admission for Escherichia coli sepsis and discharge on Keflex comes back to the hospital with complaints of rash on his face, nausea, shortness of breath. He was diagnosed with shingles and admitted to the hospital for Valtrex therapy. Because of concern of superinfection, the patient was initiated on vancomycin as well. Infectious disease specialist, Dr. Sampson Goon was consulted, VZV PCR was taken as well as surface culture. Rash improved, by the day of discharge surface culture showed normal flora, PCR was pending. ID recommended to continue  Valtrex and Doxycycline for facial rash, adding Bactroban cream/ointment, keflex for prior E. Coli sepsis. Urine culture was negative. Because of poor appetite and minimally elevated troponin as well as shortness of breath consultations with cardiologist and gastroenterologist were obtained, who recommended to  continue conservative therapy and follow-up with them as outpatient. Patient was felt to be stable to be discharged home. He was evaluated by PT and no PT follow up was recommended at home.Marland Kitchen His appetite improved. Discussion  by problem: #1. Dyspnea, very likely due to some element of fluid overload in view of history of cardiomyopathy with ejection fraction of around 25%, patient was continued on Lasix while he was on IV antibiotics in the hospital, watching his kidney function closely.  VQ scan revealed no perfusion defects, low probability scan. Dyspnea improved.  #2. Elevated troponin, likely demand ischemia, appreciate cardiologist input, . Follow up with cardiologist as outpatient, continue Cozaar, Toprol, aspirin #3. Renal insufficiency, improved with diuresis #4. Leukocytosis, improved with antibiotics, follow as outpatient #5. Thrombocytopenia, improved, HIT is pending, follow as outpatient #6. Nausea with poor by mouth intake, KUB showed no ileus, continue patient on Protonix, appreciate gastroenterologist input, follow-up as outpatient , EGD if needed. Oral intake has improved by the day of discharge. #7 Rash, likely superinfected shingles, appreciate Dr. Jarrett Ables input, continue Valtrex and Doxycycline, Bactroban cream.  #8. BPH with urinary retention status post Foley catheter placement, patient's prostate is very large on CT scan done in the recent past, patient was seen by urologist on prior admission and recommended to be discharged with Foley catheter for outpatient urology follow-up, discussed with patient's wife extensively.  #9. Generalized weakness, physical therapist evaluated patient and felt he does not need no follow up   DISCHARGE CONDITIONS:   stable  CONSULTS OBTAINED:  Treatment Team:  Lamar Blinks, MD Clydie Braun, MD  DRUG ALLERGIES:  Allergies  Allergen Reactions  . Bactrim [Sulfamethoxazole-Trimethoprim] Hives and Swelling  . Lisinopril  Swelling  . Sulfa Antibiotics Hives and Swelling    DISCHARGE MEDICATIONS:   Discharge Medication List as of 07/07/2015  3:36 PM    START taking these medications   Details  doxycycline (VIBRAMYCIN) 100 MG capsule Take 1 capsule (100 mg total) by mouth 2 (two) times daily., Starting 07/07/2015, Until Discontinued, Normal    mupirocin cream (BACTROBAN) 2 % Apply 1 application topically 2 (two) times daily. Please apply intranasally twice a day for 5 days and on the face daily until resolution of rash, Starting 07/07/2015, Until Discontinued, Normal    valACYclovir (VALTREX) 1000 MG tablet Take 1 tablet (1,000 mg total) by mouth 3 (three) times daily., Starting 07/07/2015, Until Discontinued, Normal      CONTINUE these medications which have NOT CHANGED   Details  aspirin EC 81 MG tablet Take 81 mg by mouth daily., Until Discontinued, Historical Med    cephALEXin (KEFLEX) 500 MG capsule Take 1 capsule (500 mg total) by mouth every 8 (eight) hours., Starting 07/03/2015, Until Discontinued, Normal    feeding supplement, ENSURE ENLIVE, (ENSURE ENLIVE) LIQD Take 237 mLs by mouth 3 (three) times daily between meals., Starting 07/03/2015, Until Discontinued, Normal    loperamide (IMODIUM) 2 MG capsule Take 1 capsule (2 mg total) by mouth every 4 (four) hours as needed for diarrhea or loose stools., Starting 07/03/2015, Until Discontinued, Normal    losartan (COZAAR) 100 MG tablet Take 100 mg by mouth daily., Until Discontinued, Historical Med    metoprolol succinate (TOPROL-XL) 50 MG 24 hr tablet Take 50 mg by mouth daily. , Until Discontinued, Historical Med    Multiple Vitamin (MULTIVITAMIN WITH MINERALS) TABS tablet Take 1 tablet by mouth daily., Until Discontinued, Historical Med    simvastatin (ZOCOR) 40 MG tablet Take 40 mg by mouth at bedtime., Until Discontinued, Historical Med    tamsulosin (FLOMAX) 0.4 MG CAPS capsule Take 0.4 mg by mouth daily., Until Discontinued, Historical Med       STOP taking these medications     cholestyramine light (PREVALITE) 4 g packet          DISCHARGE INSTRUCTIONS:    Follow up with PCP, cardiology, urology, GI  If you experience worsening of your admission symptoms, develop shortness of breath, life threatening emergency, suicidal or homicidal thoughts you must seek medical attention immediately by calling 911 or calling your MD immediately  if symptoms less severe.  You Must read complete instructions/literature along with all the possible adverse reactions/side effects for all the Medicines you take and that have been prescribed to you. Take any new Medicines after you have completely understood and accept all the possible adverse reactions/side effects.   Please note  You were cared for by a hospitalist during your hospital stay. If you have any questions about your discharge medications or the care you received while you were in the hospital after you are discharged, you can call the unit and asked to speak with the hospitalist on call if the hospitalist that took care of you is not available. Once you are discharged, your primary care physician will handle any further medical issues. Please note that NO REFILLS for any discharge medications will be authorized once you are discharged, as it is imperative that you return to your primary care physician (or establish a relationship with a primary care physician if you do not have one) for your aftercare needs so that  they can reassess your need for medications and monitor your lab values.    Today   CHIEF COMPLAINT:   Chief Complaint  Patient presents with  . Rash  . Shortness of Breath    HISTORY OF PRESENT ILLNESS:  Casey Murphy  is a 70 y.o. male with a known history of of recent admission for Escherichia coli sepsis and discharge on Keflex comes back to the hospital with complaints of rash on his face, nausea, shortness of breath. He was diagnosed with shingles and admitted  to the hospital for Valtrex therapy. Because of concern of superinfection, the patient was initiated on vancomycin as well. Infectious disease specialist, Dr. Sampson Goon was consulted, VZV PCR was taken as well as surface culture. Rash improved, by the day of discharge surface culture showed normal flora, PCR was pending. ID recommended to continue  Valtrex and Doxycycline for facial rash, adding Bactroban cream/ointment, keflex for prior E. Coli sepsis. Urine culture was negative. Because of poor appetite and minimally elevated troponin as well as shortness of breath consultations with cardiologist and gastroenterologist were obtained, who recommended to continue conservative therapy and follow-up with them as outpatient. Patient was felt to be stable to be discharged home. He was evaluated by PT and no PT follow up was recommended at home.Marland Kitchen His appetite improved. Discussion  by problem: #1. Dyspnea, very likely due to some element of fluid overload in view of history of cardiomyopathy with ejection fraction of around 25%, patient was continued on Lasix while he was on IV antibiotics in the hospital, watching his kidney function closely.  VQ scan revealed no perfusion defects, low probability scan. Dyspnea improved.  #2. Elevated troponin, likely demand ischemia, appreciate cardiologist input, . Follow up with cardiologist as outpatient, continue Cozaar, Toprol, aspirin #3. Renal insufficiency, improved with diuresis #4. Leukocytosis, improved with antibiotics, follow as outpatient #5. Thrombocytopenia, improved, HIT is pending, follow as outpatient #6. Nausea with poor by mouth intake, KUB showed no ileus, continue patient on Protonix, appreciate gastroenterologist input, follow-up as outpatient , EGD if needed. Oral intake has improved by the day of discharge. #7 Rash, likely superinfected shingles, appreciate Dr. Jarrett Ables input, continue Valtrex and Doxycycline, Bactroban cream.  #8. BPH with  urinary retention status post Foley catheter placement, patient's prostate is very large on CT scan done in the recent past, patient was seen by urologist on prior admission and recommended to be discharged with Foley catheter for outpatient urology follow-up, discussed with patient's wife extensively.  #9. Generalized weakness, physical therapist evaluated patient and felt he does not need no follow up   VITAL SIGNS:  Blood pressure 136/74, pulse 74, temperature 98 F (36.7 C), temperature source Oral, resp. rate 18, height  (1.905 m), weight 96.979 kg (213 lb 12.8 oz), SpO2 100 %.  I/O:  No intake or output data in the 24 hours ending 07/08/15 1650  PHYSICAL EXAMINATION:  GENERAL:  70 y.o.-year-old patient lying in the bed with no acute distress.  EYES: Pupils equal, round, reactive to light and accommodation. No scleral icterus. Extraocular muscles intact.  HEENT: Head atraumatic, normocephalic. Oropharynx and nasopharynx clear.  NECK:  Supple, no jugular venous distention. No thyroid enlargement, no tenderness.  LUNGS: Normal breath sounds bilaterally, no wheezing, rales,rhonchi or crepitation. No use of accessory muscles of respiration.  CARDIOVASCULAR: S1, S2 normal. No murmurs, rubs, or gallops.  ABDOMEN: Soft, non-tender, non-distended. Bowel sounds present. No organomegaly or mass.  EXTREMITIES: No pedal edema, cyanosis, or clubbing.  NEUROLOGIC:  Cranial nerves II through XII are intact. Muscle strength 5/5 in all extremities. Sensation intact. Gait not checked.  PSYCHIATRIC: The patient is alert and oriented x 3.  SKIN: scaling rash on R cheek, chin and left chin, resolving, falling off scales, no purulence   DATA REVIEW:   CBC  Recent Labs Lab 07/07/15 0802  WBC 13.2*  HGB 14.4  HCT 43.5  PLT 190    Chemistries   Recent Labs Lab 07/06/15 1935 07/07/15 0802  NA 136  --   K 3.8  --   CL 109  --   CO2 18*  --   GLUCOSE 110*  --   BUN 21*  --   CREATININE  1.16 1.10  CALCIUM 8.2*  --   AST 32  --   ALT 37  --   ALKPHOS 106  --   BILITOT 0.5  --     Cardiac Enzymes  Recent Labs Lab 07/05/15 1022  TROPONINI 0.39*    Microbiology Results  Results for orders placed or performed during the hospital encounter of 07/04/15  Urine culture     Status: None   Collection Time: 07/04/15  8:24 PM  Result Value Ref Range Status   Specimen Description URINE, RANDOM  Final   Special Requests NONE  Final   Culture NO GROWTH 2 DAYS  Final   Report Status 07/06/2015 FINAL  Final  Wound culture     Status: None (Preliminary result)   Collection Time: 07/05/15  4:08 PM  Result Value Ref Range Status   Specimen Description EYE  Final   Special Requests NONE  Final   Gram Stain RARE WBC SEEN RARE GRAM POSITIVE COCCI IN PAIRS   Final   Culture NORMAL SKIN FLORA  Final   Report Status PENDING  Incomplete    RADIOLOGY:  No results found.  EKG:   Orders placed or performed during the hospital encounter of 07/04/15  . ED EKG  . ED EKG  . EKG 12-Lead  . EKG 12-Lead  . EKG      Management plans discussed with the patient, family and they are in agreement.  CODE STATUS:  Code Status History    Date Active Date Inactive Code Status Order ID Comments User Context   07/04/2015  6:09 PM 07/07/2015  7:35 PM Full Code [id number]  Auburn Bilberry, MD ED   06/29/2015  6:02 AM 07/03/2015  9:32 PM Full Code [id number]  Ihor Austin, MD ED      TOTAL TIME TAKING CARE OF THIS PATIENT: 40 minutes.    Katharina Caper M.D on 07/08/2015 at 4:50 PM  Between 7am to 6pm - Pager - [phone]  After 6pm go to [email or web address] - password [email or web address] [facility]  [facility]  Office  [phone]  CC: Primary care physician; Quita Skye, MD

## 2015-07-09 LAB — WOUND CULTURE: CULTURE: NORMAL

## 2015-07-09 LAB — VARICELLA-ZOSTER BY PCR: VARICELLA-ZOSTER, PCR: NEGATIVE

## 2017-08-05 IMAGING — CR DG CHEST 1V PORT
1 series · 1 of 1 positions shown · non-contrast
Comparison: Chest radiograph performed earlier today at [DATE] a.m.

CLINICAL DATA: Acute onset of shortness of breath. Initial
encounter.

EXAM:
PORTABLE CHEST 1 VIEW

[portable]
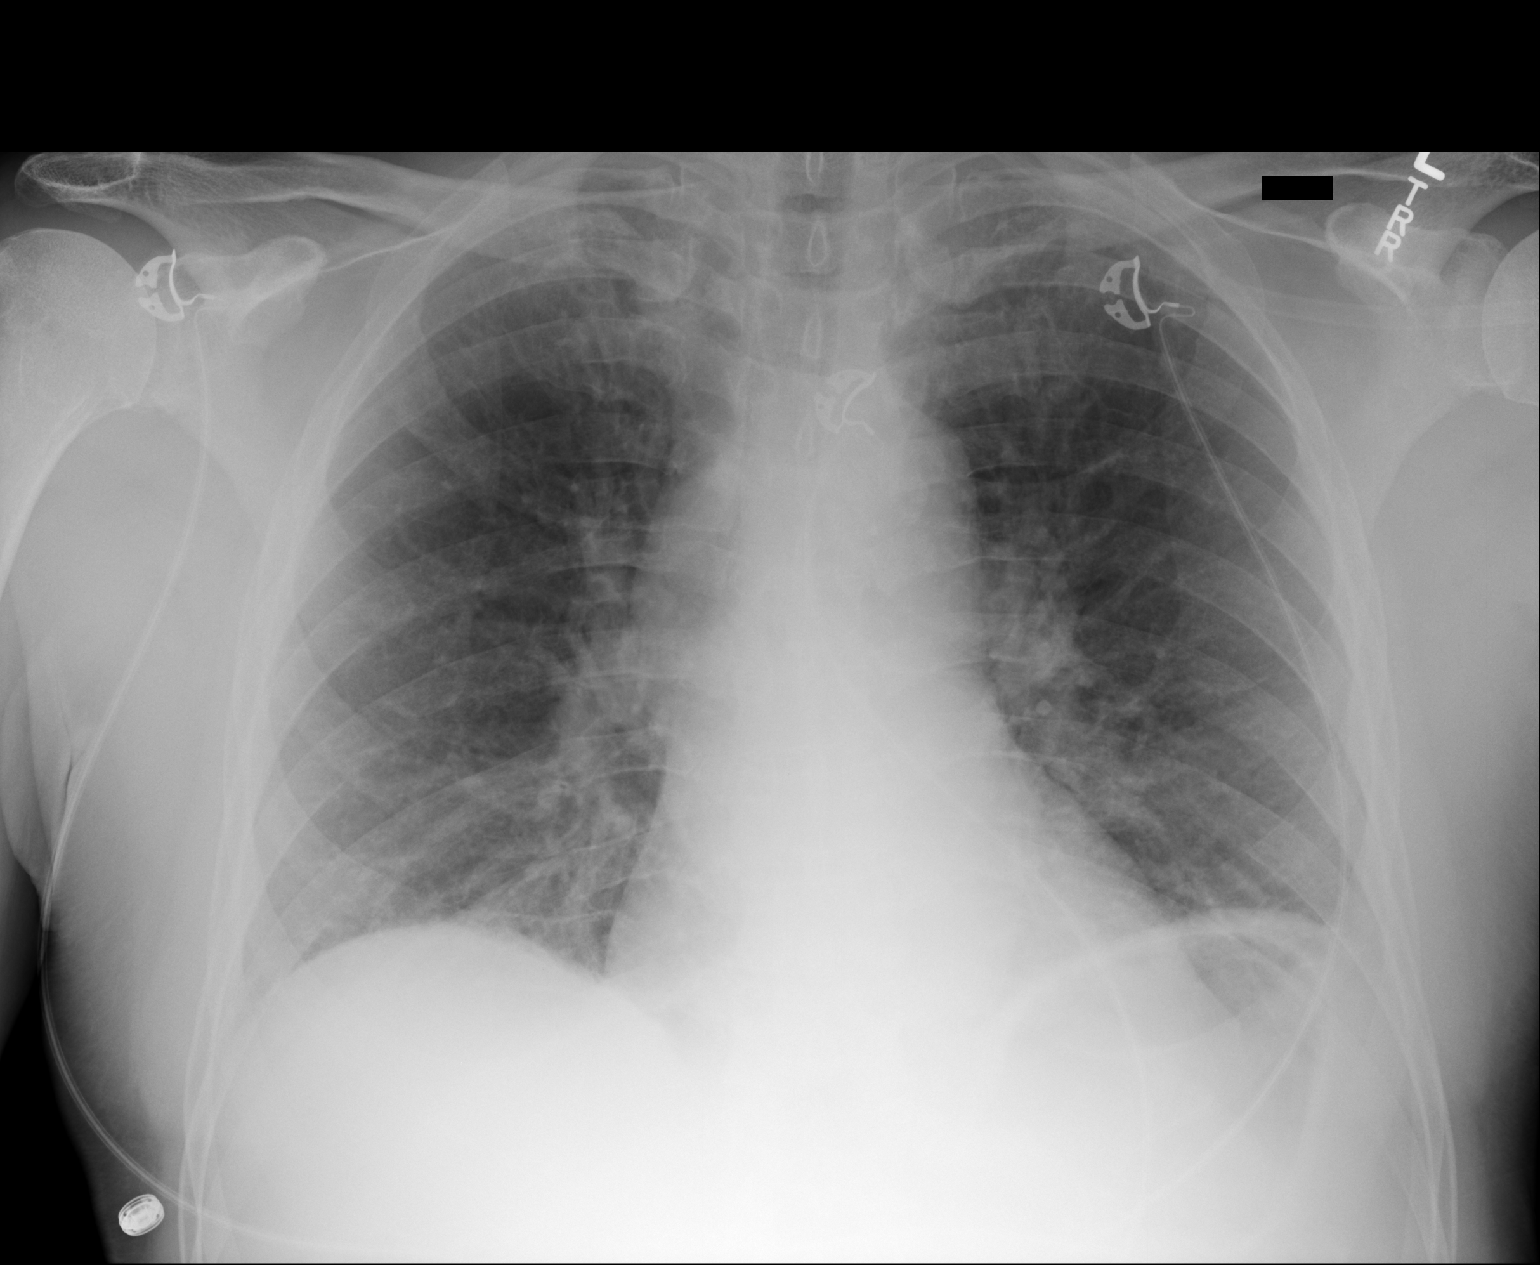

[1 of 1 positions shown; findings below may reference images not displayed]

FINDINGS: The lungs are relatively well expanded. Vascular congestion is
noted, with mild bibasilar atelectasis. No pleural effusion or
pneumothorax is seen

The cardiomediastinal silhouette is normal in size. No acute osseous
abnormalities identified.
IMPRESSION: Vascular congestion, with mild bibasilar atelectasis.

## 2022-08-03 ENCOUNTER — Emergency Department
Admission: EM | Admit: 2022-08-03 | Discharge: 2022-08-03 | Disposition: A | Discharge status: Home / Self Care | Payer: Medicare HMO | Attending: Emergency Medicine | Admitting: Emergency Medicine

## 2022-08-03 ENCOUNTER — Emergency Department: Payer: Medicare HMO

## 2022-08-03 DIAGNOSIS — I110 Hypertensive heart disease with heart failure: Secondary | ICD-10-CM | POA: Diagnosis not present

## 2022-08-03 DIAGNOSIS — R7989 Other specified abnormal findings of blood chemistry: Secondary | ICD-10-CM | POA: Insufficient documentation

## 2022-08-03 DIAGNOSIS — R310 Gross hematuria: Secondary | ICD-10-CM | POA: Diagnosis not present

## 2022-08-03 DIAGNOSIS — I509 Heart failure, unspecified: Secondary | ICD-10-CM | POA: Insufficient documentation

## 2022-08-03 DIAGNOSIS — I2510 Atherosclerotic heart disease of native coronary artery without angina pectoris: Secondary | ICD-10-CM | POA: Insufficient documentation

## 2022-08-03 DIAGNOSIS — R0789 Other chest pain: Secondary | ICD-10-CM | POA: Diagnosis not present

## 2022-08-03 LAB — BASIC METABOLIC PANEL
Anion gap: 8 (ref 5–15)
BUN: 27 mg/dL — ABNORMAL HIGH (ref 8–23)
CO2: 23 mmol/L (ref 22–32)
Calcium: 8.9 mg/dL (ref 8.9–10.3)
Chloride: 108 mmol/L (ref 98–111)
Creatinine, Ser: 1.63 mg/dL — ABNORMAL HIGH (ref 0.61–1.24)
GFR, Estimated: 43 mL/min — ABNORMAL LOW (ref >60)
Glucose, Bld: 117 mg/dL — ABNORMAL HIGH (ref 70–99)
Potassium: 4.1 mmol/L (ref 3.5–5.1)
Sodium: 139 mmol/L (ref 135–145)

## 2022-08-03 LAB — URINALYSIS, ROUTINE W REFLEX MICROSCOPIC
RBC / HPF: >50 RBC/hpf (ref 0–5)
Specific Gravity, Urine: 1.022 (ref 1.005–1.030)
Squamous Epithelial / HPF: NONE SEEN /HPF (ref 0–5)
WBC, UA: >50 WBC/hpf (ref 0–5)

## 2022-08-03 LAB — CBC WITH DIFFERENTIAL/PLATELET
Abs Immature Granulocytes: 0.02 10*3/uL (ref 0.00–0.07)
Basophils Absolute: 0.1 10*3/uL (ref 0.0–0.1)
Basophils Relative: 1 %
Eosinophils Absolute: 0.6 10*3/uL — ABNORMAL HIGH (ref 0.0–0.5)
Eosinophils Relative: 6 %
HCT: 47.4 % (ref 39.0–52.0)
Hemoglobin: 15.5 g/dL (ref 13.0–17.0)
Immature Granulocytes: 0 %
Lymphocytes Relative: 34 %
Lymphs Abs: 3.3 10*3/uL (ref 0.7–4.0)
MCH: 29.4 pg (ref 26.0–34.0)
MCHC: 32.7 g/dL (ref 30.0–36.0)
MCV: 89.9 fL (ref 80.0–100.0)
Monocytes Absolute: 0.7 10*3/uL (ref 0.1–1.0)
Monocytes Relative: 8 %
Neutro Abs: 4.9 10*3/uL (ref 1.7–7.7)
Neutrophils Relative %: 51 %
Platelets: 235 10*3/uL (ref 150–400)
RBC: 5.27 MIL/uL (ref 4.22–5.81)
RDW: 14.6 % (ref 11.5–15.5)
WBC: 9.6 10*3/uL (ref 4.0–10.5)
nRBC: 0 % (ref 0.0–0.2)

## 2022-08-03 LAB — TROPONIN I (HIGH SENSITIVITY)
Troponin I (High Sensitivity): 14 ng/L (ref <18)
Troponin I (High Sensitivity): 14 ng/L (ref <18)

## 2022-08-03 MED ORDER — CEPHALEXIN 500 MG PO CAPS
500.0000 mg | ORAL_CAPSULE | Freq: Once | ORAL | Status: AC
  Administered 2022-08-03: 500 mg via ORAL
  Filled 2022-08-03: qty 1

## 2022-08-03 MED ORDER — CEPHALEXIN 500 MG PO CAPS: 500.0000 mg | ORAL_CAPSULE | Freq: Four times a day (QID) | ORAL | 0 refills | Status: AC

## 2022-08-03 NOTE — ED Triage Notes (Signed)
Patient C/O chest pressure that began about an hour ago, and has resolved. Patient also C/O frank hematuria that began yesterday.

## 2022-08-03 NOTE — ED Provider Notes (Signed)
[facility] Provider Note    Event Date/Time   First MD Initiated Contact with Patient 08/03/22 [phone]     (approximate)   History   Chief Complaint Chest Pain and Hematuria   HPI  Casey Murphy is a 77 y.o. male with past medical history of hypertension, CAD, CHF, and BPH who presents to the ED complaining of chest pain and hematuria.  Patient reports that he noticed blood in his urine throughout the day yesterday but he denies any associated dysuria, abdominal pain, flank pain, or fever.  He was getting ready to go to bed around 2:00 this morning when he developed sensation of tightness and tingling in his chest.  He denies any associated fevers, cough, or difficulty breathing.  He eventually called EMS, but now states that chest pain and tingling have resolved.     Physical Exam   Triage Vital Signs: ED Triage Vitals  Enc Vitals Group     BP 08/03/22 0328 (!) 153/92     Pulse Rate 08/03/22 0328 72     Resp 08/03/22 0328 18     Temp 08/03/22 0328 98.4 F (36.9 C)     Temp Source 08/03/22 0328 Oral     SpO2 08/03/22 0328 96 %     Weight 08/03/22 0329 212 lb (96.2 kg)     Height --      Head Circumference --      Peak Flow --      Pain Score 08/03/22 0328 0     Pain Loc --      Pain Edu? --      Excl. in [place]? --     Most recent vital signs: Vitals:   08/03/22 0500 08/03/22 0530  BP: 130/83 127/81  Pulse: 65 64  Resp: 15 10  Temp:    SpO2: 98% 96%    Constitutional: Alert and oriented. Eyes: Conjunctivae are normal. Head: Atraumatic. Nose: No congestion/rhinnorhea. Mouth/Throat: Mucous membranes are moist.  Cardiovascular: Normal rate, regular rhythm. Grossly normal heart sounds.  2+ radial pulses bilaterally. Respiratory: Normal respiratory effort.  No retractions. Lungs CTAB. Gastrointestinal: Soft and nontender. No distention. Musculoskeletal: No lower extremity tenderness nor edema.  Neurologic:  Normal speech and language. No  gross focal neurologic deficits are appreciated.    ED Results / Procedures / Treatments   Labs (all labs ordered are listed, but only abnormal results are displayed) Labs Reviewed  CBC WITH DIFFERENTIAL/PLATELET - Abnormal; Notable for the following components:      Result Value   Eosinophils Absolute 0.6 (*)    All other components within normal limits  BASIC METABOLIC PANEL - Abnormal; Notable for the following components:   Glucose, Bld 117 (*)    BUN 27 (*)    Creatinine, Ser 1.63 (*)    GFR, Estimated 43 (*)    All other components within normal limits  URINALYSIS, ROUTINE W REFLEX MICROSCOPIC - Abnormal; Notable for the following components:   Color, Urine RED (*)    APPearance TURBID (*)    Glucose, UA   (*)    Value: TEST NOT REPORTED DUE TO COLOR INTERFERENCE OF URINE PIGMENT   Hgb urine dipstick   (*)    Value: TEST NOT REPORTED DUE TO COLOR INTERFERENCE OF URINE PIGMENT   Bilirubin Urine   (*)    Value: TEST NOT REPORTED DUE TO COLOR INTERFERENCE OF URINE PIGMENT   Ketones, ur   (*)    Value: TEST NOT  REPORTED DUE TO COLOR INTERFERENCE OF URINE PIGMENT   Protein, ur   (*)    Value: TEST NOT REPORTED DUE TO COLOR INTERFERENCE OF URINE PIGMENT   Nitrite   (*)    Value: TEST NOT REPORTED DUE TO COLOR INTERFERENCE OF URINE PIGMENT   Leukocytes,Ua   (*)    Value: TEST NOT REPORTED DUE TO COLOR INTERFERENCE OF URINE PIGMENT   Bacteria, UA RARE (*)    All other components within normal limits  URINE CULTURE  TROPONIN I (HIGH SENSITIVITY)  TROPONIN I (HIGH SENSITIVITY)     EKG  ED ECG REPORT I, Blake Divine, the attending physician, personally viewed and interpreted this ECG.   Date: 08/03/2022  EKG Time: 3:35  Rate: 69  Rhythm: normal sinus rhythm  Axis: Normal  Intervals:none  ST&T Change: None  RADIOLOGY Chest x-ray reviewed and interpreted by me with no infiltrate, edema, or effusion.  PROCEDURES:  Critical Care performed:  No  Procedures   MEDICATIONS ORDERED IN ED: Medications  cephALEXin (KEFLEX) capsule 500 mg (has no administration in time range)     IMPRESSION / MDM / ASSESSMENT AND PLAN / ED COURSE  I reviewed the triage vital signs and the nursing notes.                              77 y.o. male with past medical history of hypertension, CAD, CHF, and BPH who presents to the ED complaining of hematuria throughout the day yesterday, developed chest tightness and tingling about 1 hour prior to arrival.  Patient's presentation is most consistent with acute presentation with potential threat to life or bodily function.  Differential diagnosis includes, but is not limited to, ACS, PE, dissection, pneumonia, pneumothorax, GERD, musculoskeletal pain, anxiety, UTI, AKI, anemia, electrolyte abnormality, kidney stone.  Patient well-appearing and in no acute distress, vital signs are unremarkable.  EKG shows no evidence of arrhythmia or ischemia, symptoms are atypical for ACS but we will screen troponin given his significant cardiac history.  Chest x-ray and urinalysis are also pending at this time, patient with no symptoms concerning for kidney stone.  Labs show no significant anemia or leukocytosis, he does have elevated creatinine however no recent labs for comparison and it is unclear whether this represents AKI.  No evidence of urinary retention at this time contributing to an AKI, he is tolerating oral intake without difficulty.  Urine is grossly bloody, we will send for culture but patient would benefit from round of antibiotics to see if this helps with his hematuria.  He states that he sees urology at [facility] and he was counseled to follow-up with their local urologist.  Cardiac workup has been unremarkable with 2 sets of troponin being negative and no ongoing chest pain here in the ED.  Chest x-ray is also unremarkable.  He is appropriate for discharge home and was counseled to follow-up with PCP for recheck  of kidney function.  He was counseled to return to the ED for new or worsening symptoms, patient agrees with plan.      FINAL CLINICAL IMPRESSION(S) / ED DIAGNOSES   Final diagnoses:  Atypical chest pain  Gross hematuria     Rx / DC Orders   ED Discharge Orders          Ordered    cephALEXin (KEFLEX) 500 MG capsule  4 times daily        08/03/22 0612  Note:  This document was prepared using Dragon voice recognition software and may include unintentional dictation errors.   Blake Divine, MD 08/03/22 [phone]

## 2022-08-04 LAB — URINE CULTURE: Culture: NO GROWTH

## 2022-08-25 ENCOUNTER — Encounter: Payer: Self-pay | Admitting: Emergency Medicine

## 2022-08-25 ENCOUNTER — Emergency Department
Admission: EM | Admit: 2022-08-25 | Discharge: 2022-08-25 | Disposition: A | Discharge status: Home / Self Care | Payer: Medicare HMO | Source: Home / Self Care | Attending: Emergency Medicine | Admitting: Emergency Medicine

## 2022-08-25 ENCOUNTER — Other Ambulatory Visit: Payer: Self-pay

## 2022-08-25 DIAGNOSIS — I110 Hypertensive heart disease with heart failure: Secondary | ICD-10-CM | POA: Insufficient documentation

## 2022-08-25 DIAGNOSIS — R944 Abnormal results of kidney function studies: Secondary | ICD-10-CM | POA: Insufficient documentation

## 2022-08-25 DIAGNOSIS — R310 Gross hematuria: Secondary | ICD-10-CM | POA: Diagnosis not present

## 2022-08-25 DIAGNOSIS — R319 Hematuria, unspecified: Secondary | ICD-10-CM | POA: Insufficient documentation

## 2022-08-25 DIAGNOSIS — I509 Heart failure, unspecified: Secondary | ICD-10-CM | POA: Insufficient documentation

## 2022-08-25 DIAGNOSIS — D500 Iron deficiency anemia secondary to blood loss (chronic): Secondary | ICD-10-CM | POA: Diagnosis not present

## 2022-08-25 DIAGNOSIS — I2510 Atherosclerotic heart disease of native coronary artery without angina pectoris: Secondary | ICD-10-CM | POA: Insufficient documentation

## 2022-08-25 LAB — CBC WITH DIFFERENTIAL/PLATELET
Abs Immature Granulocytes: 0.06 10*3/uL (ref 0.00–0.07)
Basophils Absolute: 0.1 10*3/uL (ref 0.0–0.1)
Basophils Relative: 0 %
Eosinophils Absolute: 0 10*3/uL (ref 0.0–0.5)
Eosinophils Relative: 0 %
HCT: 43.8 % (ref 39.0–52.0)
Hemoglobin: 14.6 g/dL (ref 13.0–17.0)
Immature Granulocytes: 0 %
Lymphocytes Relative: 13 %
Lymphs Abs: 2 10*3/uL (ref 0.7–4.0)
MCH: 29.6 pg (ref 26.0–34.0)
MCHC: 33.3 g/dL (ref 30.0–36.0)
MCV: 88.8 fL (ref 80.0–100.0)
Monocytes Absolute: 0.8 10*3/uL (ref 0.1–1.0)
Monocytes Relative: 5 %
Neutro Abs: 11.8 10*3/uL — ABNORMAL HIGH (ref 1.7–7.7)
Neutrophils Relative %: 82 %
Platelets: 259 10*3/uL (ref 150–400)
RBC: 4.93 MIL/uL (ref 4.22–5.81)
RDW: 14.3 % (ref 11.5–15.5)
WBC: 14.7 10*3/uL — ABNORMAL HIGH (ref 4.0–10.5)
nRBC: 0 % (ref 0.0–0.2)

## 2022-08-25 LAB — URINALYSIS, ROUTINE W REFLEX MICROSCOPIC
Bacteria, UA: NONE SEEN
RBC / HPF: >50 RBC/hpf (ref 0–5)
Specific Gravity, Urine: 1.035 — ABNORMAL HIGH (ref 1.005–1.030)
Squamous Epithelial / HPF: NONE SEEN /HPF (ref 0–5)
WBC, UA: >50 WBC/hpf (ref 0–5)

## 2022-08-25 LAB — BASIC METABOLIC PANEL
Anion gap: 7 (ref 5–15)
BUN: 27 mg/dL — ABNORMAL HIGH (ref 8–23)
CO2: 20 mmol/L — ABNORMAL LOW (ref 22–32)
Calcium: 9 mg/dL (ref 8.9–10.3)
Chloride: 103 mmol/L (ref 98–111)
Creatinine, Ser: 1.45 mg/dL — ABNORMAL HIGH (ref 0.61–1.24)
GFR, Estimated: 49 mL/min — ABNORMAL LOW (ref >60)
Glucose, Bld: 124 mg/dL — ABNORMAL HIGH (ref 70–99)
Potassium: 4.1 mmol/L (ref 3.5–5.1)
Sodium: 130 mmol/L — ABNORMAL LOW (ref 135–145)

## 2022-08-25 MED ORDER — OXYCODONE HCL 5 MG PO TABS: 5.0000 mg | ORAL_TABLET | Freq: Four times a day (QID) | ORAL | 0 refills | Status: AC | PRN

## 2022-08-25 MED ORDER — CEPHALEXIN 500 MG PO CAPS: 500.0000 mg | ORAL_CAPSULE | Freq: Four times a day (QID) | ORAL | 0 refills | Status: DC

## 2022-08-25 MED ORDER — CEPHALEXIN 500 MG PO CAPS
500.0000 mg | ORAL_CAPSULE | Freq: Once | ORAL | Status: AC
  Administered 2022-08-25: 500 mg via ORAL
  Filled 2022-08-25: qty 1

## 2022-08-25 NOTE — Discharge Instructions (Addendum)
Tylenol 650 mg every 6 hours for pain. Take oxycodone as prescribed for breakthrough or severe pain.  Please call your urologist tomorrow for a follow-up appointment for further assessment of hematuria, or bleeding in your urine.  Since you are forming blood clots you are at risk for urinary retention which means not fully emptying your bladder.  At this time in the emergency department it seems that you are able to fully empty your bladder so we did not put in a Foley catheter.  If you are unable to urinate, feel severe pain in your lower abdomen, or have any other new or unexpected symptoms then come back to the emergency department for recheck.  Take antibiotics for the full course as prescribed.  Thank you for choosing [place] for your health care today!  Please see your primary doctor this week for a follow up appointment.   Sometimes, in the early stages of certain disease courses it is difficult to detect in the emergency department evaluation -- so, it is important that you continue to monitor your symptoms and call your doctor right away or return to the emergency department if you develop any new or worsening symptoms.  Please go to the following website to schedule new (and existing) patient appointments:   [email or web address]  If you do not have a primary doctor try calling the following clinics to establish care:  If you have insurance:  [facility] [phone] [place] [address]   [facility]  [phone] [place] [place] [address]   If you do not have insurance:  [facility]  [phone] [address] [place] [address]   The following is another list of primary care offices in the area who are accepting new patients at this time.  Please reach out to one of them directly and let them know you would like to schedule an appointment to follow up on an Emergency  Department visit, and/or to establish a new primary care provider (PCP).  There are likely other primary care clinics in the are who are accepting new patients, but this is an excellent place to start:  [facility] physician: Dr Lavon Paganini [address] [place], [place] [address] [phone]  [facility] Lead Physician: Dr Steele Sizer [address], [place], [place] [address] [phone]  [facility] Physician: Dr Park Liter [address] [place], [place] [address] [phone]  [facility] Lead Physician: Dr Dewaine Oats [place], [place], [place] [address] [phone]  [facility] Physician: Dr Halina Maidens [address] [place],  [address] [phone]   It was my pleasure to care for you today.   [place]ette Jacelyn Grip, MD

## 2022-08-25 NOTE — ED Triage Notes (Addendum)
Pt arrives via EMS from home with hematuria since yesterday afternoon. Pt endorses urinary frequency and pain. Pt here on 2/24 and diagnosed with UTI, started on abx.

## 2022-08-25 NOTE — ED Notes (Signed)
Pt verbalized understanding of discharge paperwork, including prescriptions and follow-up care. Pt explained return precautions.

## 2022-08-25 NOTE — ED Provider Notes (Signed)
[facility] Provider Note    Event Date/Time   First MD Initiated Contact with Patient 08/25/22 [phone]     (approximate)   History   Hematuria   HPI  Casey Murphy is a 77 y.o. male   Past medical history of BPH, hypertension, CHF, CAD who presents the emergency department with hematuria.  He has had previous history of hematuria and sees a urologist in [place].  Symptoms started last night with bright red blood from the penis as well as blood clots.  He has a sensation that he is not fully emptying his bladder.  He has some suprapubic discomfort and some dysuria when he pushes and pain in the penis when he pushes blood clots through.  He reports no trauma.  He has no testicular pain.  He has no flank pain.  He has no fevers or chills.  He has no other acute medical complaints.   External Medical Documents Reviewed: Emergency department visit dated 08/03/2022 with hematuria and was discharged with antibiotics.      Physical Exam   Triage Vital Signs: ED Triage Vitals  Enc Vitals Group     BP 08/25/22 0848 (!) 158/122     Pulse Rate 08/25/22 0848 73     Resp 08/25/22 0848 16     Temp 08/25/22 0848 98.5 F (36.9 C)     Temp Source 08/25/22 0848 Oral     SpO2 08/25/22 0848 97 %     Weight 08/25/22 0849 215 lb (97.5 kg)     Height 08/25/22 0849 6\' 3"  (1.905 m)     Head Circumference --      Peak Flow --      Pain Score 08/25/22 0849 4     Pain Loc --      Pain Edu? --      Excl. in [place]? --     Most recent vital signs: Vitals:   08/25/22 0848 08/25/22 0901  BP: (!) 158/122 (!) 159/83  Pulse: 73 69  Resp: 16 16  Temp: 98.5 F (36.9 C)   SpO2: 97% 92%    General: Awake, no distress.  CV:  Good peripheral perfusion.  Resp:  Normal effort.  Abd:  No distention.  Other:  Suprapubic discomfort to palpation, no rigidity or guarding.  Soft nontender to all other quadrants.  His penis appears normal but there is a blood clot dangling from the tip  of the urethra.  Testicles without skin changes to the scrotum nor tenderness to palpation - vital signs within normal limits no fever, slightly hypertensive at 150/80   ED Results / Procedures / Treatments   Labs (all labs ordered are listed, but only abnormal results are displayed) Labs Reviewed  URINALYSIS, ROUTINE W REFLEX MICROSCOPIC - Abnormal; Notable for the following components:      Result Value   Color, Urine RED (*)    APPearance TURBID (*)    Specific Gravity, Urine 1.035 (*)    Glucose, UA   (*)    Value: TEST NOT REPORTED DUE TO COLOR INTERFERENCE OF URINE PIGMENT   Hgb urine dipstick   (*)    Value: TEST NOT REPORTED DUE TO COLOR INTERFERENCE OF URINE PIGMENT   Bilirubin Urine   (*)    Value: TEST NOT REPORTED DUE TO COLOR INTERFERENCE OF URINE PIGMENT   Ketones, ur   (*)    Value: TEST NOT REPORTED DUE TO COLOR INTERFERENCE OF URINE PIGMENT   Protein,  ur   (*)    Value: TEST NOT REPORTED DUE TO COLOR INTERFERENCE OF URINE PIGMENT   Nitrite   (*)    Value: TEST NOT REPORTED DUE TO COLOR INTERFERENCE OF URINE PIGMENT   Leukocytes,Ua   (*)    Value: TEST NOT REPORTED DUE TO COLOR INTERFERENCE OF URINE PIGMENT   All other components within normal limits  BASIC METABOLIC PANEL - Abnormal; Notable for the following components:   Sodium 130 (*)    CO2 20 (*)    Glucose, Bld 124 (*)    BUN 27 (*)    Creatinine, Ser 1.45 (*)    GFR, Estimated 49 (*)    All other components within normal limits  CBC WITH DIFFERENTIAL/PLATELET - Abnormal; Notable for the following components:   WBC 14.7 (*)    Neutro Abs 11.8 (*)    All other components within normal limits     I ordered and reviewed the above labs they are notable for Cr 1.45 from 1.6 and H&H stable at 14/44   PROCEDURES:  Critical Care performed: No  Procedures   MEDICATIONS ORDERED IN ED: Medications  cephALEXin (KEFLEX) capsule 500 mg (500 mg Oral Given 08/25/22 0920)     IMPRESSION / MDM /  ASSESSMENT AND PLAN / ED COURSE  I reviewed the triage vital signs and the nursing notes.                                Patient's presentation is most consistent with acute presentation with potential threat to life or bodily function.  Differential diagnosis includes, but is not limited to, acute blood loss anemia, urinary tract infection, urinary retention, malignancy   MDM: This is a patient with recurrent hematuria and a history of BPH, postvoid residual shows 0 to 50 cc in the bladder he does not appear to be retaining.  He is passing blood clots.  He has suprapubic pain.  In the past his urinalyses and cultures have been unrevealing but he has gotten symptomatically improved with antibiotics I will start him on antibiotics again today.  Check kidney function basic labs, H&H.  Since he is not acutely retaining at this time I do not see a need for Foley catheter placement.  He will need close follow-up with his urologist for further management of his hematuria and further evaluation to assess for malignancy, other pathologies that account for recurrent bleeding.  I considered hospitalization for admission or observation however given the patient has urology establish care and will call in the morning for follow-up, has no signs of acute urinary retention, and labs are at baseline with baseline H&H and baseline kidney function, I think outpatient monitoring and follow-up most appropriate at this time.  He was given strict return precautions for any new or worsening symptoms.        FINAL CLINICAL IMPRESSION(S) / ED DIAGNOSES   Final diagnoses:  Hematuria, unspecified type     Rx / DC Orders   ED Discharge Orders          Ordered    cephALEXin (KEFLEX) 500 MG capsule  4 times daily        08/25/22 0930             Note:  This document was prepared using Dragon voice recognition software and may include unintentional dictation errors.    Lucillie Garfinkel, MD 08/25/22  [phone]

## 2022-08-28 ENCOUNTER — Inpatient Hospital Stay
Admission: EM | Admit: 2022-08-28 | Discharge: 2022-08-30 | DRG: 696 | Disposition: A | Discharge status: Home / Self Care | Payer: Medicare HMO | Attending: Internal Medicine | Admitting: Internal Medicine

## 2022-08-28 ENCOUNTER — Inpatient Hospital Stay: Payer: Medicare HMO

## 2022-08-28 ENCOUNTER — Encounter: Payer: Self-pay | Admitting: Internal Medicine

## 2022-08-28 ENCOUNTER — Other Ambulatory Visit: Payer: Self-pay

## 2022-08-28 DIAGNOSIS — E785 Hyperlipidemia, unspecified: Secondary | ICD-10-CM | POA: Diagnosis present

## 2022-08-28 DIAGNOSIS — I2510 Atherosclerotic heart disease of native coronary artery without angina pectoris: Secondary | ICD-10-CM | POA: Diagnosis present

## 2022-08-28 DIAGNOSIS — Z978 Presence of other specified devices: Secondary | ICD-10-CM

## 2022-08-28 DIAGNOSIS — N3289 Other specified disorders of bladder: Secondary | ICD-10-CM | POA: Diagnosis not present

## 2022-08-28 DIAGNOSIS — N401 Enlarged prostate with lower urinary tract symptoms: Secondary | ICD-10-CM | POA: Diagnosis present

## 2022-08-28 DIAGNOSIS — D638 Anemia in other chronic diseases classified elsewhere: Secondary | ICD-10-CM | POA: Diagnosis present

## 2022-08-28 DIAGNOSIS — Z881 Allergy status to other antibiotic agents status: Secondary | ICD-10-CM | POA: Diagnosis not present

## 2022-08-28 DIAGNOSIS — Z87891 Personal history of nicotine dependence: Secondary | ICD-10-CM

## 2022-08-28 DIAGNOSIS — D500 Iron deficiency anemia secondary to blood loss (chronic): Principal | ICD-10-CM

## 2022-08-28 DIAGNOSIS — R338 Other retention of urine: Secondary | ICD-10-CM | POA: Diagnosis present

## 2022-08-28 DIAGNOSIS — R319 Hematuria, unspecified: Secondary | ICD-10-CM | POA: Diagnosis present

## 2022-08-28 DIAGNOSIS — Z8249 Family history of ischemic heart disease and other diseases of the circulatory system: Secondary | ICD-10-CM

## 2022-08-28 DIAGNOSIS — N390 Urinary tract infection, site not specified: Secondary | ICD-10-CM | POA: Diagnosis present

## 2022-08-28 DIAGNOSIS — Z888 Allergy status to other drugs, medicaments and biological substances status: Secondary | ICD-10-CM | POA: Diagnosis not present

## 2022-08-28 DIAGNOSIS — N179 Acute kidney failure, unspecified: Secondary | ICD-10-CM | POA: Diagnosis present

## 2022-08-28 DIAGNOSIS — I252 Old myocardial infarction: Secondary | ICD-10-CM

## 2022-08-28 DIAGNOSIS — Z79899 Other long term (current) drug therapy: Secondary | ICD-10-CM

## 2022-08-28 DIAGNOSIS — I5042 Chronic combined systolic (congestive) and diastolic (congestive) heart failure: Secondary | ICD-10-CM | POA: Diagnosis present

## 2022-08-28 DIAGNOSIS — R310 Gross hematuria: Secondary | ICD-10-CM | POA: Diagnosis present

## 2022-08-28 DIAGNOSIS — Z882 Allergy status to sulfonamides status: Secondary | ICD-10-CM | POA: Diagnosis not present

## 2022-08-28 DIAGNOSIS — D62 Acute posthemorrhagic anemia: Secondary | ICD-10-CM | POA: Diagnosis present

## 2022-08-28 DIAGNOSIS — I110 Hypertensive heart disease with heart failure: Secondary | ICD-10-CM | POA: Diagnosis present

## 2022-08-28 DIAGNOSIS — R339 Retention of urine, unspecified: Secondary | ICD-10-CM | POA: Diagnosis present

## 2022-08-28 DIAGNOSIS — Z7982 Long term (current) use of aspirin: Secondary | ICD-10-CM | POA: Diagnosis not present

## 2022-08-28 DIAGNOSIS — I10 Essential (primary) hypertension: Secondary | ICD-10-CM | POA: Diagnosis present

## 2022-08-28 DIAGNOSIS — N400 Enlarged prostate without lower urinary tract symptoms: Secondary | ICD-10-CM | POA: Diagnosis present

## 2022-08-28 LAB — CBC
HCT: 24.6 % — ABNORMAL LOW (ref 39.0–52.0)
HCT: 24.7 % — ABNORMAL LOW (ref 39.0–52.0)
Hemoglobin: 8.2 g/dL — ABNORMAL LOW (ref 13.0–17.0)
Hemoglobin: 8.2 g/dL — ABNORMAL LOW (ref 13.0–17.0)
MCH: 30 pg (ref 26.0–34.0)
MCH: 29.9 pg (ref 26.0–34.0)
MCHC: 33.3 g/dL (ref 30.0–36.0)
MCHC: 33.2 g/dL (ref 30.0–36.0)
MCV: 90.1 fL (ref 80.0–100.0)
MCV: 90.1 fL (ref 80.0–100.0)
Platelets: 220 10*3/uL (ref 150–400)
Platelets: 204 10*3/uL (ref 150–400)
RBC: 2.73 MIL/uL — ABNORMAL LOW (ref 4.22–5.81)
RBC: 2.74 MIL/uL — ABNORMAL LOW (ref 4.22–5.81)
RDW: 14.6 % (ref 11.5–15.5)
RDW: 14.6 % (ref 11.5–15.5)
WBC: 12.7 10*3/uL — ABNORMAL HIGH (ref 4.0–10.5)
WBC: 12.6 10*3/uL — ABNORMAL HIGH (ref 4.0–10.5)
nRBC: 0 % (ref 0.0–0.2)
nRBC: 0 % (ref 0.0–0.2)

## 2022-08-28 LAB — URINALYSIS, ROUTINE W REFLEX MICROSCOPIC
Bilirubin Urine: NEGATIVE
Glucose, UA: NEGATIVE mg/dL
Ketones, ur: NEGATIVE mg/dL
Leukocytes,Ua: NEGATIVE
Nitrite: NEGATIVE
Protein, ur: NEGATIVE mg/dL
RBC / HPF: >50 RBC/hpf (ref 0–5)
Specific Gravity, Urine: 1.004 — ABNORMAL LOW (ref 1.005–1.030)
Squamous Epithelial / HPF: NONE SEEN /HPF (ref 0–5)
pH: 5 (ref 5.0–8.0)

## 2022-08-28 LAB — PROCALCITONIN: Procalcitonin: <0.1 ng/mL

## 2022-08-28 LAB — BASIC METABOLIC PANEL
Anion gap: 10 (ref 5–15)
BUN: 44 mg/dL — ABNORMAL HIGH (ref 8–23)
CO2: 20 mmol/L — ABNORMAL LOW (ref 22–32)
Calcium: 8.6 mg/dL — ABNORMAL LOW (ref 8.9–10.3)
Chloride: 104 mmol/L (ref 98–111)
Creatinine, Ser: 2.48 mg/dL — ABNORMAL HIGH (ref 0.61–1.24)
GFR, Estimated: 26 mL/min — ABNORMAL LOW (ref >60)
Glucose, Bld: 128 mg/dL — ABNORMAL HIGH (ref 70–99)
Potassium: 4.9 mmol/L (ref 3.5–5.1)
Sodium: 134 mmol/L — ABNORMAL LOW (ref 135–145)

## 2022-08-28 LAB — CBC WITH DIFFERENTIAL/PLATELET
Abs Immature Granulocytes: 0.05 10*3/uL (ref 0.00–0.07)
Basophils Absolute: 0.1 10*3/uL (ref 0.0–0.1)
Basophils Relative: 0 %
Eosinophils Absolute: 0.6 10*3/uL — ABNORMAL HIGH (ref 0.0–0.5)
Eosinophils Relative: 4 %
HCT: 26.2 % — ABNORMAL LOW (ref 39.0–52.0)
Hemoglobin: 8.8 g/dL — ABNORMAL LOW (ref 13.0–17.0)
Immature Granulocytes: 0 %
Lymphocytes Relative: 16 %
Lymphs Abs: 2.1 10*3/uL (ref 0.7–4.0)
MCH: 30 pg (ref 26.0–34.0)
MCHC: 33.6 g/dL (ref 30.0–36.0)
MCV: 89.4 fL (ref 80.0–100.0)
Monocytes Absolute: 1.1 10*3/uL — ABNORMAL HIGH (ref 0.1–1.0)
Monocytes Relative: 8 %
Neutro Abs: 9.5 10*3/uL — ABNORMAL HIGH (ref 1.7–7.7)
Neutrophils Relative %: 72 %
Platelets: 210 10*3/uL (ref 150–400)
RBC: 2.93 MIL/uL — ABNORMAL LOW (ref 4.22–5.81)
RDW: 14.5 % (ref 11.5–15.5)
WBC: 13.4 10*3/uL — ABNORMAL HIGH (ref 4.0–10.5)
nRBC: 0 % (ref 0.0–0.2)

## 2022-08-28 LAB — PROTIME-INR
INR: 1.1 (ref 0.8–1.2)
Prothrombin Time: 14.3 seconds (ref 11.4–15.2)

## 2022-08-28 LAB — APTT: aPTT: 31 seconds (ref 24–36)

## 2022-08-28 LAB — LACTIC ACID, PLASMA: Lactic Acid, Venous: 1.3 mmol/L (ref 0.5–1.9)

## 2022-08-28 LAB — BRAIN NATRIURETIC PEPTIDE: B Natriuretic Peptide: 34.2 pg/mL (ref 0.0–100.0)

## 2022-08-28 MED ORDER — OXYCODONE HCL 5 MG PO TABS: 5.0000 mg | ORAL_TABLET | Freq: Four times a day (QID) | ORAL | Status: DC | PRN

## 2022-08-28 MED ORDER — SODIUM CHLORIDE 0.9 % IV SOLN: INTRAVENOUS | Status: DC

## 2022-08-28 MED ORDER — ACETAMINOPHEN 325 MG PO TABS: 650.0000 mg | ORAL_TABLET | Freq: Four times a day (QID) | ORAL | Status: DC | PRN

## 2022-08-28 MED ORDER — LACTATED RINGERS IV BOLUS
1000.0000 mL | Freq: Once | INTRAVENOUS | Status: AC
  Administered 2022-08-28: 1000 mL via INTRAVENOUS

## 2022-08-28 MED ORDER — SIMVASTATIN 20 MG PO TABS
40.0000 mg | ORAL_TABLET | Freq: Every day | ORAL | Status: DC
  Administered 2022-08-28 – 2022-08-29 (×2): 40 mg via ORAL
  Filled 2022-08-28 (×2): qty 2

## 2022-08-28 MED ORDER — ADULT MULTIVITAMIN W/MINERALS CH
1.0000 | ORAL_TABLET | Freq: Every day | ORAL | Status: DC
  Administered 2022-08-28 – 2022-08-30 (×3): 1 via ORAL
  Filled 2022-08-28 (×3): qty 1

## 2022-08-28 MED ORDER — ONDANSETRON HCL 4 MG/2ML IJ SOLN: 4.0000 mg | Freq: Three times a day (TID) | INTRAMUSCULAR | Status: DC | PRN

## 2022-08-28 MED ORDER — TAMSULOSIN HCL 0.4 MG PO CAPS
0.4000 mg | ORAL_CAPSULE | Freq: Every day | ORAL | Status: DC
  Administered 2022-08-28 – 2022-08-30 (×3): 0.4 mg via ORAL
  Filled 2022-08-28 (×3): qty 1

## 2022-08-28 MED ORDER — SODIUM CHLORIDE 0.9 % IV SOLN
1.0000 g | INTRAVENOUS | Status: DC
  Administered 2022-08-28 – 2022-08-30 (×3): 1 g via INTRAVENOUS
  Filled 2022-08-28 (×3): qty 10

## 2022-08-28 MED ORDER — HYDRALAZINE HCL 20 MG/ML IJ SOLN: 5.0000 mg | INTRAMUSCULAR | Status: DC | PRN

## 2022-08-28 NOTE — ED Provider Notes (Signed)
[facility] Provider Note    Event Date/Time   First MD Initiated Contact with Patient 08/28/22 [phone]     (approximate)   History   Weakness and Hematuria   HPI  Casey Murphy is a 77 y.o. male who presents to the ED for evaluation of Weakness and Hematuria   History of CAD and diastolic dysfunction, HTN and BPH.  Was seen in our ED on 3/17 due to hematuria, diagnosed with cystitis, provided Keflex and discharged recommendations to return to his urologist  Patient reports following up with his urologist yesterday, 3/19, and they placed a Foley catheter due to urinary retention.  They urged him to continue his course of antibiotics  He returns to the ED this morning after an episode of weakness.  He reports sitting on the toilet to drain the Foley catheter bag, reports it was taking a while and he also did pass a bowel movement, so he ended up sitting there for at least 30 minutes.  After this prolonged period on the toilet, he reports his bilateral legs were tingling and feeling weak, he was able to stand up but only take a few steps due to the tingling and weakness.  Thankfully his wife was nearby to help him down and EMS was called due to his bilateral leg weakness and presyncopal dizziness.  No syncope.  EMS reports soft pressures improving with IV fluids.  Physical Exam   Triage Vital Signs: ED Triage Vitals  Enc Vitals Group     BP      Pulse      Resp      Temp      Temp src      SpO2      Weight      Height      Head Circumference      Peak Flow      Pain Score      Pain Loc      Pain Edu?      Excl. in [place]?     Most recent vital signs: Vitals:   08/28/22 0600 08/28/22 0630  BP: (!) 99/51 107/61  Pulse: 80 79  Resp:  14  Temp:    SpO2: 100% 100%    General: Awake, no distress.  CV:  Good peripheral perfusion.  Resp:  Normal effort.  Abd:  No distention.  Suprapubic tenderness is present, abdomen is otherwise benign.  Indwelling  Foley catheter is present draining frank bloody urine MSK:  No deformity noted.  Neuro:  No focal deficits appreciated. Other:     ED Results / Procedures / Treatments   Labs (all labs ordered are listed, but only abnormal results are displayed) Labs Reviewed  CBC WITH DIFFERENTIAL/PLATELET - Abnormal; Notable for the following components:      Result Value   WBC 13.4 (*)    RBC 2.93 (*)    Hemoglobin 8.8 (*)    HCT 26.2 (*)    Neutro Abs 9.5 (*)    Monocytes Absolute 1.1 (*)    Eosinophils Absolute 0.6 (*)    All other components within normal limits  BASIC METABOLIC PANEL - Abnormal; Notable for the following components:   Sodium 134 (*)    CO2 20 (*)    Glucose, Bld 128 (*)    BUN 44 (*)    Creatinine, Ser 2.48 (*)    Calcium 8.6 (*)    GFR, Estimated 26 (*)    All other components  within normal limits    EKG Sinus rhythm with a rate of 80 bpm.  Normal axis and intervals.  No clear signs of acute ischemia.  Poor quality EKG.  RADIOLOGY   Official radiology report(s): No results found.  PROCEDURES and INTERVENTIONS:  Procedures  Medications  lactated ringers bolus 1,000 mL (has no administration in time range)     IMPRESSION / MDM / ASSESSMENT AND PLAN / ED COURSE  I reviewed the triage vital signs and the nursing notes.  Differential diagnosis includes, but is not limited to, sepsis, pyelonephritis, blood loss anemia, AKI, dehydration, vasovagal episode  {Patient presents with symptoms of an acute illness or injury that is potentially life-threatening  77 year old male presents with an episode of weakness with evidence of an AKI and blood loss anemia requiring medical admission.  Despite his indwelling Foley catheter draining gross blood, he has retained urine suspected of blood clots requiring three-way placement for bladder irrigation.  Blood work returns with significant drop in hemoglobin and increase in creatinine from his visit just 3 days ago.   Suspect that the AKI is obstructive in nature versus prerenal.  We will place a three-way, initiate irrigation and start fluid resuscitation and consult medicine for admission.  No indications for blood transfusions at this point  Clinical Course as of 08/28/22 0713  Wed Aug 28, 2022  0602 Bladder scan with 3-400 cc despite the Foley catheter draining frank blood.  I am concerned about obstructive clots and I recommend three-way catheter for continuous bladder irrigation [DS]  [phone] Reassessed and educated wife and patient of decreasing hemoglobin and increasing creatinine my recommendations for admission.  They are agreeable [DS]    Clinical Course User Index [DS] Vladimir Crofts, MD     FINAL CLINICAL IMPRESSION(S) / ED DIAGNOSES   Final diagnoses:  Blood loss anemia  AKI (acute kidney injury) ([place])  Gross hematuria     Rx / DC Orders   ED Discharge Orders     None        Note:  This document was prepared using Dragon voice recognition software and may include unintentional dictation errors.   Vladimir Crofts, MD 08/28/22 [phone]

## 2022-08-28 NOTE — ED Triage Notes (Signed)
Pt from home BIB EMS for weakness and hematuria, seen here 4D ago for same sent home with pain meds and abx. Foley placed by urologist. Pt reports he had a BM and had increased tingling and weakness in his legs after sitting for 30 mins. "I sat too long because I was scared to get up."

## 2022-08-28 NOTE — ED Notes (Signed)
This RN at bedside, pt urine now red. Emptied foley cath and this RN hung another 3040mL bag for CBI at this time. Foley catheter still draining at this time.

## 2022-08-28 NOTE — Consult Note (Signed)
Urology Consult  I have been asked to see the patient by Dr. Blaine Hamper, for evaluation and management of gross hematuria.  Chief Complaint: Gross hematuria  History of Present Illness: Casey Murphy is a 77 y.o. year old male with PMH BPH with prostatomegaly, gross hematuria, CAD, and CHF who presented to the ED this morning with reports of weakness and hematuria.  He is accompanied today by his wife at the bedside, who contributes to HPI.  Has been seen twice in our emergency department in the past month with reports of gross hematuria, most recently 3 days ago.  UA at that time was notable for >50 WBCs/hpf and >50 RBCs/hpf, no bacteria.  He was started on empiric Keflex 500 mg 4 times daily x 5 days for possible UTI, though urine culture was not sent.  He followed up outpatient with Dr. Valma Cava yesterday, and a Foley catheter was placed due to urinary retention with instructions to continue antibiotics.  He returned to our emergency department early this morning with reports of fatigue with a near syncopal event and ongoing gross hematuria.  Admission labs notable for WBC count 13.4; hemoglobin 8.8 (14.6 3 days ago); creatinine 2.48 (baseline 1.5); procalcitonin <0.10; and lactate 1.3.  UA and culture have been ordered, but not yet collected.  Foley catheter was exchanged in the emergency department due to elevated bladder scan and CBI was initiated.  Today he reports an approximate 3-4 month history of intermittent painless gross hematuria. He reports an approximate 40-pack year smoking history. He denies recent CTU or cystoscopy.  Upon my arrival, there is a 63 [place] three-way hematuria catheter in place draining red urine without clots.  CBI has run dry.  His primary urologist is Dr. Valma Cava at [facility]. He reports undergoing a prostate procedure about 8 years ago to "chip away" at the prostate due to difficulty urinating. He was having gross hematuria at that time, but states the  procedure was more to treat LUTS. He denies current pharmacotherapy for BPH. He states Dr. Valma Cava has told him the gross hematuria is prostatic in origin.  Past Medical History:  Diagnosis Date   BPH (benign prostatic hyperplasia)    CAD (coronary artery disease)    CHF (congestive heart failure) (HCC)    Hypertension    MI, old    MRSA bacteremia     Past Surgical History:  Procedure Laterality Date   NONE     Home Medications:  Current Meds  Medication Sig   acetaminophen (TYLENOL) 500 MG tablet Take 500 mg by mouth every 6 (six) hours as needed for mild pain, moderate pain, fever or headache.   aspirin EC 81 MG tablet Take 81 mg by mouth daily.   cephALEXin (KEFLEX) 500 MG capsule Take 1 capsule (500 mg total) by mouth 4 (four) times daily for 5 days.   losartan (COZAAR) 100 MG tablet Take 100 mg by mouth daily.   metoprolol succinate (TOPROL-XL) 50 MG 24 hr tablet Take 50 mg by mouth daily.    Multiple Vitamin (MULTIVITAMIN WITH MINERALS) TABS tablet Take 1 tablet by mouth daily.   oxyCODONE (ROXICODONE) 5 MG immediate release tablet Take 1 tablet (5 mg total) by mouth every 6 (six) hours as needed for up to 6 doses for severe pain or breakthrough pain.   simvastatin (ZOCOR) 40 MG tablet Take 40 mg by mouth at bedtime.    Allergies:  Allergies  Allergen Reactions   Bactrim [Sulfamethoxazole-Trimethoprim] Hives and Swelling  Lisinopril Swelling   Sulfa Antibiotics Hives and Swelling    Family History  Problem Relation Age of Onset   Hypertension Other     Social History:  reports that he quit smoking about 7 years ago. His smoking use included cigarettes. He has a 55.00 pack-year smoking history. He has never used smokeless tobacco. He reports current alcohol use of about 1.0 standard drink of alcohol per week. He reports that he does not use drugs.  ROS: A complete review of systems was performed.  All systems are negative except for pertinent findings as  noted.  Physical Exam:  Vital signs in last 24 hours: Temp:  [99.5 F (37.5 C)] 99.5 F (37.5 C) (03/20 0531) Pulse Rate:  [73-83] 73 (03/20 1000) Resp:  [14-19] 18 (03/20 1000) BP: (99-122)/(51-89) 102/89 (03/20 1000) SpO2:  [97 %-100 %] 100 % (03/20 1000) Weight:  [93.6 kg] 93.6 kg (03/20 0543) Constitutional:  Alert and oriented, no acute distress HEENT: [place] AT, moist mucus membranes Cardiovascular: No clubbing, cyanosis, or edema Respiratory: Normal respiratory effort GI: Suprapubic tenderness without palpable bladder Skin: No rashes, bruises or suspicious lesions Neurologic: Grossly intact, no focal deficits, moving all 4 extremities Psychiatric: Normal mood and affect  Laboratory Data:  Recent Labs    08/28/22 0539  WBC 13.4*  HGB 8.8*  HCT 26.2*   Recent Labs    08/28/22 0539  NA 134*  K 4.9  CL 104  CO2 20*  GLUCOSE 128*  BUN 44*  CREATININE 2.48*  CALCIUM 8.6*   Recent Labs    08/28/22 0539  INR 1.1   Urinalysis    Component Value Date/Time   COLORURINE RED (A) 08/25/2022 0859   APPEARANCEUR TURBID (A) 08/25/2022 0859   LABSPEC 1.035 (H) 08/25/2022 0859   PHURINE  08/25/2022 0859    TEST NOT REPORTED DUE TO COLOR INTERFERENCE OF URINE PIGMENT   GLUCOSEU (A) 08/25/2022 0859    TEST NOT REPORTED DUE TO COLOR INTERFERENCE OF URINE PIGMENT   HGBUR (A) 08/25/2022 0859    TEST NOT REPORTED DUE TO COLOR INTERFERENCE OF URINE PIGMENT   BILIRUBINUR (A) 08/25/2022 0859    TEST NOT REPORTED DUE TO COLOR INTERFERENCE OF URINE PIGMENT   KETONESUR (A) 08/25/2022 0859    TEST NOT REPORTED DUE TO COLOR INTERFERENCE OF URINE PIGMENT   PROTEINUR (A) 08/25/2022 0859    TEST NOT REPORTED DUE TO COLOR INTERFERENCE OF URINE PIGMENT   NITRITE (A) 08/25/2022 0859    TEST NOT REPORTED DUE TO COLOR INTERFERENCE OF URINE PIGMENT   LEUKOCYTESUR (A) 08/25/2022 0859    TEST NOT REPORTED DUE TO COLOR INTERFERENCE OF URINE PIGMENT   Results for orders placed or performed  during the hospital encounter of 08/03/22  Urine Culture     Status: None   Collection Time: 08/03/22  3:32 AM   Specimen: Urine, Clean Catch  Result Value Ref Range Status   Specimen Description   Final    URINE, CLEAN CATCH Performed at [facility], [address], [place], [place] [address]    Special Requests   Final    NONE Performed at [facility], [address] [place], [place] [address]    Culture   Final    NO GROWTH Performed at [facility], [place] [address] [place], [place] [address]    Report Status 08/04/2022 FINAL  Final    Radiologic Imaging: No results found.  Catheter Irrigation and Exchange Patient is present  today for manual catheter irrigation due to gross hematuria. On arrival, there is a 20Fr 3-way hematuria catheter in place draining red urine without clots. CBI is attached but has run dry.  Using clean technique, I detached the night bag from the patient's catheter and instilled 60cc of sterile water into the bladder via the catheter in place. Instillation was easy, but I was unable to pull back on the irrigation syringe consistent with obstructing clot. At this point I elected to upsize the patient's catheter to facilitate manual irrigation.  39ml of water was removed from the balloon, a 20FR three-way hematuria foley cath was removed without difficulty.  Patient was cleaned and prepped in a sterile fashion with betadine and 2% lidocaine jelly was instilled into the urethra. A 24FR coude three-way hematuria foley cath was replaced into the bladder, no complications were noted. Urine return was not immediately noted. The balloon was filled with 46ml of sterile water.   Next I proceeded with manual bladder irrigation. 2500 mL of sterile water was instilled into the bladder with a 19mL [place] syringe through the catheter in place.  2553mL of urine return was cleared from the bladder with evacuation of 500ccs of clot material. Efflux  cleared from maroon to pink tinged with the procedure and the catheter irrigated easily upon completion. Upon completion, the catheter was draining well and was reattached to the night bag for drainage. CBI was resumed and efflux was pink tinged on fast drip. Patient tolerated well.   Assessment & Plan:  77 year old male with PMH BPH with prostatomegaly s/p apparent outlet procedure about 8 years ago, gross hematuria with a 40-pack-year smoking history, CAD, and CHF admitted with gross hematuria with a 6 point hemoglobin drop over the past 3 days and clot retention.  Catheter exchange and manual irrigation performed at the bedside as above.  Urine cleared substantially and catheter was irrigating easily upon completion. No indication for urgent surgical intervention at this time. Will obtain limited pelvic ultrasound to assess for residual clot burden. Will attempt further manual irrigation at the bedside if required.  He will require hematuria workup with CTU and cystoscopy; will defer to Dr. Valma Cava to perform this on an outpatient basis. Will continue CBI to manage his gross hematuria during admission and consider clot evac/fulguration as needed based on clinical course. Agree with transfusion as needed per primary team in the setting of acute blood loss of urinary source.  Recommendations: -Continue CBI and titrate to keep efflux light pink or clearer -Manually irrigate Foley as needed to keep catheter draining -Limited pelvic US this morning to assess for residual clot -Trend CBC -Continue antibiotics given extent of manipulation this morning -Keep NPO today pending [place] results as above -NPO at midnight tonight pending reassessment in the AM tomorrow, consider clot evac versus fulguration based on clinical course  Thank you for involving me in this patient's care, I will continue to follow along.  Debroah Loop, PA-C 08/28/2022 10:45 AM

## 2022-08-28 NOTE — ED Notes (Signed)
Urology team at bedside at this time.

## 2022-08-28 NOTE — ED Notes (Signed)
Pt moved to hospital bed at this time. Pt repositioned and placed on a hospital gown at this time.

## 2022-08-28 NOTE — Progress Notes (Signed)
Limited pelvic ultrasound shows some intraluminal bladder debris consistent with clot material.  Zara Council and I returned to the bedside at midday to reattempt manual bladder irrigation.  Using clean technique, I irrigated the patient's catheter with 350 cc of sterile water but was only able to evacuate an additional 3 cc of well-organized clot fragments.  Efflux remained pink-tinged in color.  At this point, suspect retained clot is too well-organized for manual irrigation.  There continues to be no evidence of significant active bleeding.  We reattached CBI on fast drip and efflux remained pink-tinged.  Okay for diet at this time.  Please make him n.p.o. at midnight.  Will reassess in the morning and consider cystoscopy with clot evacuation and possible fulguration if he has evidence of ongoing bleeding or concern for more retained clot.

## 2022-08-28 NOTE — ED Notes (Signed)
CBI initiated.

## 2022-08-28 NOTE — ED Notes (Signed)
Bladder scan = 306 and 400 ( repated scan.)

## 2022-08-28 NOTE — H&P (Signed)
History and Physical    Lum Pfab [id number] DOB: June 12, 1945 DOA: 08/28/2022  Referring MD/NP/PA:   PCP: Ivor Reining, MD   Patient coming from:  The patient is coming from home.     Chief Complaint: hematuria  HPI: Casey Murphy is a 77 y.o. male with medical history significant of BPH, hematuria, HTN, HLD, CAD, sCHF (2d echo on 06/29/17 with EF 25 - 30%, the repeated EF on 07/02/17 showed ef > 55%), presents with hematuria.  Pt states that he has approximate 3-4 month history of intermittent painless gross hematuria. Patient was seen in ED on 3/17 due to hematuria and was discharged on Keflex for UTI.  Patient continues to have hematuria, and was seen by his urologist yesterday 3/19 and had Foley catheter placed daily.  He has intermittent difficulty urinating and dysuria.  No fever or chills. Denies nausea, no vomiting, diarrhea or abdominal pain.  No chest pain, cough, shortness breath.  Patient has lightheadedness and dizziness. He has increased weakness and tinglings in both legs.   Pt was found to have urinary retention with 400 cc by bladder scan in ED. CBI is started in ED. Hgb dropped from 14.6 on 3/17 to 8.8.   Data reviewed independently and ED Course: pt was found to have WBC 13.4, Hgb 8.8 (14.6 on 08/25/22), Urinalysis with clear appearance, negative leukocyte, rare bacteria, WBC 11-20, worsening renal function, temperature 99.5, blood pressure 99/51, 107/61, heart rate 83, RR 17, oxygen saturation 100% on room air.  Patient is admitted to telemetry bed as inpatient.  Dr. Erlene Quan of urology is consulted.   EKG: I have personally reviewed.  Sinus rhythm, QTc 462, low voltage in lead I   Review of Systems:   General: no fevers, chills, no body weight gain, has fatigue HEENT: no blurry vision, hearing changes or sore throat Respiratory: no dyspnea, coughing, wheezing CV: no chest pain, no palpitations GI: has nausea, no vomiting, abdominal pain, diarrhea,  constipation GU: has dysuria, hematuria, difficulty urinating Ext: no leg edema Neuro: no unilateral weakness, numbness, no vision change or hearing loss. has lightheadedness. Skin: no rash, no skin tear. MSK: No muscle spasm, no deformity, no limitation of range of movement in spin Heme: No easy bruising.  Travel history: No recent long distant travel.   Allergy:  Allergies  Allergen Reactions   Bactrim [Sulfamethoxazole-Trimethoprim] Hives and Swelling   Lisinopril Swelling   Sulfa Antibiotics Hives and Swelling    Past Medical History:  Diagnosis Date   BPH (benign prostatic hyperplasia)    CAD (coronary artery disease)    CHF (congestive heart failure) (HCC)    Hypertension    MI, old    MRSA bacteremia     Past Surgical History:  Procedure Laterality Date   prostate procedure about 8 years ago to "chip away" at the prostate      Social History:  reports that he quit smoking about 7 years ago. His smoking use included cigarettes. He has a 55.00 pack-year smoking history. He has never used smokeless tobacco. He reports current alcohol use of about 1.0 standard drink of alcohol per week. He reports that he does not use drugs.  Family History:  Family History  Problem Relation Age of Onset   Heart disease Father    Breast cancer Sister    Hypertension Other      Prior to Admission medications   Medication Sig Start Date End Date Taking? Authorizing Provider  aspirin EC 81 MG tablet Take 81  mg by mouth daily.    [provider]  cephALEXin (KEFLEX) 500 MG capsule Take 1 capsule (500 mg total) by mouth 4 (four) times daily for 5 days. 08/25/22 08/30/22  Lucillie Garfinkel, MD  doxycycline (VIBRAMYCIN) 100 MG capsule Take 1 capsule (100 mg total) by mouth 2 (two) times daily. 07/07/15   Theodoro Grist, MD  feeding supplement, ENSURE ENLIVE, (ENSURE ENLIVE) LIQD Take 237 mLs by mouth 3 (three) times daily between meals. 07/03/15   Theodoro Grist, MD  loperamide (IMODIUM) 2  MG capsule Take 1 capsule (2 mg total) by mouth every 4 (four) hours as needed for diarrhea or loose stools. 07/03/15   Theodoro Grist, MD  losartan (COZAAR) 100 MG tablet Take 100 mg by mouth daily.    [provider]  metoprolol succinate (TOPROL-XL) 50 MG 24 hr tablet Take 50 mg by mouth daily.     [provider]  Multiple Vitamin (MULTIVITAMIN WITH MINERALS) TABS tablet Take 1 tablet by mouth daily.    [provider]  mupirocin cream (BACTROBAN) 2 % Apply 1 application topically 2 (two) times daily. Please apply intranasally twice a day for 5 days and on the face daily until resolution of rash 07/07/15   Theodoro Grist, MD  oxyCODONE (ROXICODONE) 5 MG immediate release tablet Take 1 tablet (5 mg total) by mouth every 6 (six) hours as needed for up to 6 doses for severe pain or breakthrough pain. 08/25/22   Lucillie Garfinkel, MD  simvastatin (ZOCOR) 40 MG tablet Take 40 mg by mouth at bedtime.    [provider]  tamsulosin (FLOMAX) 0.4 MG CAPS capsule Take 0.4 mg by mouth daily.    [provider]  valACYclovir (VALTREX) 1000 MG tablet Take 1 tablet (1,000 mg total) by mouth 3 (three) times daily. 07/07/15   Theodoro Grist, MD    Physical Exam: Vitals:   08/28/22 0830 08/28/22 0900 08/28/22 0930 08/28/22 1000  BP: 122/69 105/69 104/66 102/89  Pulse: 79 78 73 73  Resp: 15 19 16 18   Temp:      TempSrc:      SpO2: 100% 100% 97% 100%  Weight:      Height:       General: Not in acute distress HEENT:       Eyes: PERRL, EOMI, no scleral icterus.       ENT: No discharge from the ears and nose, no pharynx injection, no tonsillar enlargement.        Neck: No JVD, no bruit, no mass felt. Heme: No neck lymph node enlargement. Cardiac: S1/S2, RRR, No murmurs, No gallops or rubs. Respiratory: No rales, wheezing, rhonchi or rubs. GI: Soft, nondistended, nontender, no rebound pain, no organomegaly, BS present. GU: has gross hematuria Ext: No pitting leg edema  bilaterally. 1+DP/PT pulse bilaterally. Musculoskeletal: No joint deformities, No joint redness or warmth, no limitation of ROM in spin. Skin: No rashes.  Neuro: Alert, oriented X3, cranial nerves II-XII grossly intact, moves all extremities normally. Psych: Patient is not psychotic, no suicidal or hemocidal ideation.  Labs on Admission: I have personally reviewed following labs and imaging studies  CBC: Recent Labs  Lab 08/25/22 0859 08/28/22 0539  WBC 14.7* 13.4*  NEUTROABS 11.8* 9.5*  HGB 14.6 8.8*  HCT 43.8 26.2*  MCV 88.8 89.4  PLT 259 [id number]   Basic Metabolic Panel: Recent Labs  Lab 08/25/22 0859 08/28/22 0539  NA 130* 134*  K 4.1 4.9  CL 103 104  CO2 20*  20*  GLUCOSE 124* 128*  BUN 27* 44*  CREATININE 1.45* 2.48*  CALCIUM 9.0 8.6*   GFR: Estimated Creatinine Clearance: 29.3 mL/min (A) (by C-G formula based on SCr of 2.48 mg/dL (H)). Liver Function Tests: No results for input(s): "AST", "ALT", "ALKPHOS", "BILITOT", "PROT", "ALBUMIN" in the last 168 hours. No results for input(s): "LIPASE", "AMYLASE" in the last 168 hours. No results for input(s): "AMMONIA" in the last 168 hours. Coagulation Profile: Recent Labs  Lab 08/28/22 0539  INR 1.1   Cardiac Enzymes: No results for input(s): "CKTOTAL", "CKMB", "CKMBINDEX", "TROPONINI" in the last 168 hours. BNP (last 3 results) No results for input(s): "PROBNP" in the last 8760 hours. HbA1C: No results for input(s): "HGBA1C" in the last 72 hours. CBG: No results for input(s): "GLUCAP" in the last 168 hours. Lipid Profile: No results for input(s): "CHOL", "HDL", "LDLCALC", "TRIG", "CHOLHDL", "LDLDIRECT" in the last 72 hours. Thyroid Function Tests: No results for input(s): "TSH", "T4TOTAL", "FREET4", "T3FREE", "THYROIDAB" in the last 72 hours. Anemia Panel: No results for input(s): "VITAMINB12", "FOLATE", "FERRITIN", "TIBC", "IRON", "RETICCTPCT" in the last 72 hours. Urine analysis:    Component Value Date/Time    COLORURINE YELLOW (A) 08/28/2022 1321   APPEARANCEUR CLEAR (A) 08/28/2022 1321   LABSPEC 1.004 (L) 08/28/2022 1321   PHURINE 5.0 08/28/2022 1321   GLUCOSEU NEGATIVE 08/28/2022 1321   HGBUR LARGE (A) 08/28/2022 1321   BILIRUBINUR NEGATIVE 08/28/2022 1321   KETONESUR NEGATIVE 08/28/2022 1321   PROTEINUR NEGATIVE 08/28/2022 1321   NITRITE NEGATIVE 08/28/2022 1321   LEUKOCYTESUR NEGATIVE 08/28/2022 1321   Sepsis Labs: @LABRCNTIP (procalcitonin:4,lacticidven:4) )No results found for this or any previous visit (from the past 240 hour(s)).   Radiological Exams on Admission: US PELVIS LIMITED (TRANSABDOMINAL ONLY)  Result Date: 08/28/2022 CLINICAL DATA:  Hematuria. EXAM: LIMITED ULTRASOUND OF PELVIS TECHNIQUE: Limited transabdominal ultrasound examination of the pelvis was performed. COMPARISON:  Noncontrast CT 06/29/2015 FINDINGS: Limited examination of the bladder with specifically performed. There is a Foley catheter in place. The bladder has a irregular thickened wall and there is extensive luminal debris within the bladder. Possibilities include hematoma with the patient's clinical presentation. A mass lesion in principle is not excluded. There is also a very large prostate which is not well measured today. On prior CT scan of the prostate had dimensions approaching up to 8.5 cm. Additional workup when clinically appropriate. IMPRESSION: Foley catheter in the relatively contracted bladder. The bladder wall is diffusely thickened and there is some intraluminal debris. In addition there appears to be a very large prostate but not well measured on this limited examination. Additional workup when clinically appropriate Electronically Signed   By: Jill Side M.D.   On: 08/28/2022 12:08      Assessment/Plan Principal Problem:   Hematuria Active Problems:   Urinary retention   BPH (benign prostatic hyperplasia)   UTI (urinary tract infection)   AKI (acute kidney injury) ([place])   Acute blood  loss anemia   Chronic combined systolic and diastolic CHF (congestive heart failure) (HCC)   HTN (hypertension)   HLD (hyperlipidemia)   CAD (coronary artery disease)   Assessment and Plan:  Hematuria, urinary retention and UTI and hx of BPH: started CBI in ED. Consulted Dr. Erlene Quan of urology -will admit to tele bed as inpt -continue CBI -IV rocephin -f/u Bx and Ux. -IVF: 75 cc/h of NS  Acute blood loss anemia: Hgb 14.6 --> 8.8 -INR/PTT/type and screen -check CBC q8h for Hgb level. Goal of transufusion is Hgb <  7.0   AKI (acute kidney injury) ([place]): likely due to obstruction.  Baseline creatinine 1.1 on 08/16/2015.  His creatinine is 2.48, BUN 44, GFR 26 today. -hold [place] -IVF as abvove  Hx of chronic combined systolic and diastolic CHF (congestive heart failure) ([place]): 2D echo 06/30/2015 showed EF of 25-30% with grade 1 diastolic function, but the repeated 2D echo on 07/03/2027 showed EF > 55%.  BNP 34 today.  No leg edema.  CHF is compensated. -Watch volume status closely  HTN (hypertension) -Hold Cozaar due to AKI -Hold metoprolol due to soft blood pressure -IV hydralazine as needed  HLD (hyperlipidemia) -Zocor  CAD (coronary artery disease) -Zocor -hold ASA due to hematuria    DVT ppx: SCD  Code Status: Full code  Family Communication:  Yes, patient's wife   at bed side.   Disposition Plan:  Anticipate discharge back to previous environment  Consults called:  Dr. Erlene Quan of urology  Admission status and Level of care: Telemetry Medical:    as inpt       Dispo: The patient is from: Home              Anticipated d/c is to: Home              Anticipated d/c date is: 2 days              Patient currently is not medically stable to d/c.    Severity of Illness:  The appropriate patient status for this patient is INPATIENT. Inpatient status is judged to be reasonable and necessary in order to provide the required intensity of service to ensure the patient's  safety. The patient's presenting symptoms, physical exam findings, and initial radiographic and laboratory data in the context of their chronic comorbidities is felt to place them at high risk for further clinical deterioration. Furthermore, it is not anticipated that the patient will be medically stable for discharge from the hospital within 2 midnights of admission.   * I certify that at the point of admission it is my clinical judgment that the patient will require inpatient hospital care spanning beyond 2 midnights from the point of admission due to high intensity of service, high risk for further deterioration and high frequency of surveillance required.*       Date of Service 08/28/2022      Ivor Costa [facility]   If 7PM-7AM, please contact night-coverage [email or web address] 08/28/2022, 1:49 PM

## 2022-08-29 DIAGNOSIS — N3289 Other specified disorders of bladder: Secondary | ICD-10-CM | POA: Diagnosis not present

## 2022-08-29 DIAGNOSIS — R310 Gross hematuria: Secondary | ICD-10-CM | POA: Diagnosis not present

## 2022-08-29 DIAGNOSIS — R319 Hematuria, unspecified: Secondary | ICD-10-CM | POA: Diagnosis not present

## 2022-08-29 LAB — URINE CULTURE: Culture: NO GROWTH

## 2022-08-29 LAB — FOLATE: Folate: 38 ng/mL (ref >5.9)

## 2022-08-29 LAB — CBC
HCT: 23.3 % — ABNORMAL LOW (ref 39.0–52.0)
HCT: 23.9 % — ABNORMAL LOW (ref 39.0–52.0)
Hemoglobin: 7.7 g/dL — ABNORMAL LOW (ref 13.0–17.0)
Hemoglobin: 8 g/dL — ABNORMAL LOW (ref 13.0–17.0)
MCH: 29.8 pg (ref 26.0–34.0)
MCH: 30.2 pg (ref 26.0–34.0)
MCHC: 33 g/dL (ref 30.0–36.0)
MCHC: 33.5 g/dL (ref 30.0–36.0)
MCV: 90.3 fL (ref 80.0–100.0)
MCV: 90.2 fL (ref 80.0–100.0)
Platelets: 212 10*3/uL (ref 150–400)
Platelets: 219 10*3/uL (ref 150–400)
RBC: 2.58 MIL/uL — ABNORMAL LOW (ref 4.22–5.81)
RBC: 2.65 MIL/uL — ABNORMAL LOW (ref 4.22–5.81)
RDW: 14.6 % (ref 11.5–15.5)
RDW: 14.8 % (ref 11.5–15.5)
WBC: 12.8 10*3/uL — ABNORMAL HIGH (ref 4.0–10.5)
WBC: 12.7 10*3/uL — ABNORMAL HIGH (ref 4.0–10.5)
nRBC: 0 % (ref 0.0–0.2)
nRBC: 0 % (ref 0.0–0.2)

## 2022-08-29 LAB — BASIC METABOLIC PANEL
Anion gap: 7 (ref 5–15)
BUN: 29 mg/dL — ABNORMAL HIGH (ref 8–23)
CO2: 23 mmol/L (ref 22–32)
Calcium: 8.4 mg/dL — ABNORMAL LOW (ref 8.9–10.3)
Chloride: 110 mmol/L (ref 98–111)
Creatinine, Ser: 1.58 mg/dL — ABNORMAL HIGH (ref 0.61–1.24)
GFR, Estimated: 44 mL/min — ABNORMAL LOW (ref >60)
Glucose, Bld: 107 mg/dL — ABNORMAL HIGH (ref 70–99)
Potassium: 4.3 mmol/L (ref 3.5–5.1)
Sodium: 140 mmol/L (ref 135–145)

## 2022-08-29 LAB — FERRITIN: Ferritin: 50 ng/mL (ref 24–336)

## 2022-08-29 LAB — IRON AND TIBC
Iron: 30 ug/dL — ABNORMAL LOW (ref 45–182)
Saturation Ratios: 11 % — ABNORMAL LOW (ref 17.9–39.5)
TIBC: 283 ug/dL (ref 250–450)
UIBC: 253 ug/dL

## 2022-08-29 LAB — GLUCOSE, CAPILLARY: Glucose-Capillary: 100 mg/dL — ABNORMAL HIGH (ref 70–99)

## 2022-08-29 LAB — RETICULOCYTES
Immature Retic Fract: 22.4 % — ABNORMAL HIGH (ref 2.3–15.9)
RBC.: 2.59 MIL/uL — ABNORMAL LOW (ref 4.22–5.81)
Retic Count, Absolute: 56.2 10*3/uL (ref 19.0–186.0)
Retic Ct Pct: 2.2 % (ref 0.4–3.1)

## 2022-08-29 LAB — VITAMIN B12: Vitamin B-12: 449 pg/mL (ref 180–914)

## 2022-08-29 MED ORDER — FERROUS SULFATE 325 (65 FE) MG PO TABS
325.0000 mg | ORAL_TABLET | Freq: Two times a day (BID) | ORAL | Status: DC
  Administered 2022-08-29 – 2022-08-30 (×3): 325 mg via ORAL
  Filled 2022-08-29 (×3): qty 1

## 2022-08-29 MED ORDER — FINASTERIDE 5 MG PO TABS
5.0000 mg | ORAL_TABLET | Freq: Every day | ORAL | Status: DC
  Administered 2022-08-29 – 2022-08-30 (×2): 5 mg via ORAL
  Filled 2022-08-29 (×2): qty 1

## 2022-08-29 MED ORDER — CHLORHEXIDINE GLUCONATE CLOTH 2 % EX PADS
6.0000 | MEDICATED_PAD | Freq: Every day | CUTANEOUS | Status: DC
  Administered 2022-08-29 – 2022-08-30 (×2): 6 via TOPICAL

## 2022-08-29 NOTE — Assessment & Plan Note (Signed)
2D echo 06/30/2015 showed EF of 25-30% with grade 1 diastolic function, but the repeated 2D echo on 07/03/2027 showed EF > 55%.  BNP 34 today.  No leg edema.  CHF is compensated. -Watch volume status closely

## 2022-08-29 NOTE — Assessment & Plan Note (Signed)
-  Continue with Rocephin -Follow up final urine culture results

## 2022-08-29 NOTE — Progress Notes (Signed)
I revisited the patient at midday. Efflux remains clear on slow drip CBI. CBI clamped. If urine remains light pink or clearer at end of the day, ok to d/c CBI. Please keep Foley in place; primary urologist placed it outpatient due to concerns for urinary retention. He may keep Foley on discharge and follow-up outpatient with Dr. Valma Cava. Finasteride has been started; please also continue this on discharge.  Debroah Loop, PA-C 08/29/22 1:08 PM

## 2022-08-29 NOTE — Progress Notes (Signed)
Progress Note   Patient: Casey Murphy [id number] DOB: Nov 30, 1945 DOA: 08/28/2022     1 DOS: the patient was seen and examined on 08/29/2022   Brief hospital course: Taken from H&P.  Bakary Karrick is a 77 y.o. male with medical history significant of BPH, hematuria, HTN, HLD, CAD, sCHF (2d echo on 06/29/17 with EF 25 - 30%, the repeated EF on 07/02/17 showed ef > 55%), presents with hematuria, with associated lightheadedness and dizziness.   Pt states that he has approximate 3-4 month history of intermittent painless gross hematuria. Patient was seen in ED on 3/17 due to hematuria and was discharged on Keflex for UTI.  Pt was found to have urinary retention with 400 cc by bladder scan in ED. CBI is started in ED. Hgb dropped from 14.6 on 3/17 to 8.8.  Data reviewed independently and ED Course: pt was found to have WBC 13.4, Hgb 8.8 (14.6 on 08/25/22), Urinalysis with clear appearance, negative leukocyte, rare bacteria, WBC 11-20, worsening renal function, temperature 99.5, blood pressure 99/51, 107/61, heart rate 83, RR 17, oxygen saturation 100% on room air.  Urology was consulted, and he was started on CBI and Rocephin.  3/21: Vital stable this morning.  Labs with improving renal function, decreasing hemoglobin, this morning at 7.7. Anemia panel with concern of anemia of chronic disease and some iron deficiency.  Starting him on iron supplement.  B12 pending Limited pelvic ultrasound with clots, manual irrigation was also attempted.  Urine started improving and CBI was clamped later in the day.  Patient was also started on finasteride and he will be going home with Foley catheter in place and need to follow-up with his on urologist for further recommendations.  Assessment and Plan: * Hematuria Started improving.  Hemoglobin dropped to 7.7.   Urology clamped the CBI today. -Patient was started on finasteride -Will be going home with Foley catheter in place  Urinary retention S/p Foley  catheter in place. Patient will be discharged on Foley catheter and need to have follow-up his own urologist for further management  BPH (benign prostatic hyperplasia) Most likely the cause of current hematuria and urinary retention. -Started on finasteride  UTI (urinary tract infection) -Continue with Rocephin -Follow up final urine culture results  AKI (acute kidney injury) ([place]) Most likely secondary to obstruction.  Improving, creatinine at 1.58 this morning.  Baseline around 1.1 -Monitor renal function -Avoid nephrotoxins  Acute blood loss anemia Secondary to hematuria.  Hemoglobin at 7.7 this morning Anemia panel with anemia of chronic disease and some iron deficiency.  B12 pending. -Start him on iron supplement -Monitor hemoglobin  Chronic combined systolic and diastolic CHF (congestive heart failure) ([place]) 2D echo 06/30/2015 showed EF of 25-30% with grade 1 diastolic function, but the repeated 2D echo on 07/03/2027 showed EF > 55%.  BNP 34 today.  No leg edema.  CHF is compensated. -Watch volume status closely  HTN (hypertension) Blood pressure currently within goal. -Keeping home Cozaar on hold due to AKI -We can restart metoprolol if needed -IV hydralazine as needed  HLD (hyperlipidemia) -Continue Zocor  CAD (coronary artery disease) -Continue Zocor -Keep holding aspirin due to hematuria    Subjective: Patient was seen and examined today.  No new concern.  Urine started clearing out.  Physical Exam: Vitals:   08/28/22 1834 08/29/22 0241 08/29/22 0352 08/29/22 0751  BP: 94/69  111/75 121/64  Pulse: 75  83 74  Resp: 15  18 16   Temp: 98.4 F (36.9 C)  98.7 F (37.1 C) 97.9 F (36.6 C)  TempSrc: Oral  Oral Oral  SpO2: 100%  100% 100%  Weight:  90.6 kg    Height:       General.  Well-developed elderly man, in no acute distress. Pulmonary.  Lungs clear bilaterally, normal respiratory effort. CV.  Regular rate and rhythm, no JVD, rub or murmur. Abdomen.   Soft, nontender, nondistended, BS positive. CNS.  Alert and oriented .  No focal neurologic deficit. Extremities.  No edema, no cyanosis, pulses intact and symmetrical. Psychiatry.  Judgment and insight appears normal.   Data Reviewed: Prior data reviewed  Family Communication: Discussed with wife at bedside  Disposition: Status is: Inpatient Remains inpatient appropriate because: Severity of illness  Planned Discharge Destination: Home  Time spent: 50 minutes  This record has been created using Systems analyst. Errors have been sought and corrected,but may not always be located. Such creation errors do not reflect on the standard of care.   Author: Lorella Nimrod, MD 08/29/2022 4:11 PM  For on call review [email or web address].

## 2022-08-29 NOTE — Assessment & Plan Note (Signed)
S/p Foley catheter in place. Patient will be discharged on Foley catheter and need to have follow-up his own urologist for further management

## 2022-08-29 NOTE — Assessment & Plan Note (Signed)
Secondary to hematuria.  Hemoglobin at 7.7 this morning Anemia panel with anemia of chronic disease and some iron deficiency.  B12 pending. -Start him on iron supplement -Monitor hemoglobin

## 2022-08-29 NOTE — Progress Notes (Signed)
Urology Inpatient Progress Note  Subjective: No acute events overnight.  He is afebrile, VSS. Hemoglobin down today, 7.7.  WBC count stable, 12.8.  Creatinine has returned to baseline, 1.58.  Urine and blood cultures pending, on antibiotics as below. Foley catheter in place draining clear urine on fast drip CBI. He reports feeling well today with no acute concerns.  He reports he took finasteride in the remote past but stopped it after his prostate surgery about 8 years ago.  Anti-infectives: Anti-infectives (From admission, onward)    Start     Dose/Rate Route Frequency Ordered Stop   08/28/22 0800  cefTRIAXone (ROCEPHIN) 1 g in sodium chloride 0.9 % 100 mL IVPB        1 g 200 mL/hr over 30 Minutes Intravenous Every 24 hours 08/28/22 0746         Current Facility-Administered Medications  Medication Dose Route Frequency Provider Last Rate Last Admin   0.9 %  sodium chloride infusion   Intravenous Continuous Ivor Costa, MD 75 mL/hr at 08/29/22 0428 New Bag at 08/29/22 0428   acetaminophen (TYLENOL) tablet 650 mg  650 mg Oral Q6H PRN Ivor Costa, MD       cefTRIAXone (ROCEPHIN) 1 g in sodium chloride 0.9 % 100 mL IVPB  1 g Intravenous Q24H Ivor Costa, MD 200 mL/hr at 08/29/22 0749 1 g at 08/29/22 0749   Chlorhexidine Gluconate Cloth 2 % PADS 6 each  6 each Topical Daily Lorella Nimrod, MD       hydrALAZINE (APRESOLINE) injection 5 mg  5 mg Intravenous Q2H PRN Ivor Costa, MD       multivitamin with minerals tablet 1 tablet  1 tablet Oral Daily Ivor Costa, MD   1 tablet at 08/29/22 0912   ondansetron (ZOFRAN) injection 4 mg  4 mg Intravenous Q8H PRN Ivor Costa, MD       oxyCODONE (Oxy IR/ROXICODONE) immediate release tablet 5 mg  5 mg Oral Q6H PRN Ivor Costa, MD       simvastatin (ZOCOR) tablet 40 mg  40 mg Oral QHS Ivor Costa, MD   40 mg at 08/28/22 2232   tamsulosin (FLOMAX) capsule 0.4 mg  0.4 mg Oral Daily Ivor Costa, MD   0.4 mg at 08/29/22 0912   Objective: Vital signs in last 24  hours: Temp:  [97.9 F (36.6 C)-98.7 F (37.1 C)] 97.9 F (36.6 C) (03/21 0751) Pulse Rate:  [73-99] 74 (03/21 0751) Resp:  [15-20] 16 (03/21 0751) BP: (94-121)/(64-89) 121/64 (03/21 0751) SpO2:  [97 %-100 %] 100 % (03/21 0751) Weight:  [90.6 kg] 90.6 kg (03/21 0241)  Intake/Output from previous day: 03/20 0701 - 03/21 0700 In: 15000 [I.V.:1000; IV Piggyback:2000] Out: [id number] [Urine:24300] Intake/Output this shift: Total I/O In: -  Out: 800 [Urine:800]  Physical Exam Vitals and nursing note reviewed.  Constitutional:      General: He is not in acute distress.    Appearance: He is not ill-appearing, toxic-appearing or diaphoretic.  HENT:     Head: Normocephalic and atraumatic.  Pulmonary:     Effort: Pulmonary effort is normal. No respiratory distress.  Skin:    General: Skin is warm and dry.  Neurological:     Mental Status: He is alert and oriented to person, place, and time.  Psychiatric:        Mood and Affect: Mood normal.        Behavior: Behavior normal.    Lab Results:  Recent Labs    08/28/22 2025  08/29/22 0206  WBC 12.6* 12.8*  HGB 8.2* 7.7*  HCT 24.7* 23.3*  PLT 204 212   BMET Recent Labs    08/28/22 0539 08/29/22 0206  NA 134* 140  K 4.9 4.3  CL 104 110  CO2 20* 23  GLUCOSE 128* 107*  BUN 44* 29*  CREATININE 2.48* 1.58*  CALCIUM 8.6* 8.4*   PT/INR Recent Labs    08/28/22 0539  LABPROT 14.3  INR 1.1   Studies/Results: US PELVIS LIMITED (TRANSABDOMINAL ONLY)  Result Date: 08/28/2022 CLINICAL DATA:  Hematuria. EXAM: LIMITED ULTRASOUND OF PELVIS TECHNIQUE: Limited transabdominal ultrasound examination of the pelvis was performed. COMPARISON:  Noncontrast CT 06/29/2015 FINDINGS: Limited examination of the bladder with specifically performed. There is a Foley catheter in place. The bladder has a irregular thickened wall and there is extensive luminal debris within the bladder. Possibilities include hematoma with the patient's clinical  presentation. A mass lesion in principle is not excluded. There is also a very large prostate which is not well measured today. On prior CT scan of the prostate had dimensions approaching up to 8.5 cm. Additional workup when clinically appropriate. IMPRESSION: Foley catheter in the relatively contracted bladder. The bladder wall is diffusely thickened and there is some intraluminal debris. In addition there appears to be a very large prostate but not well measured on this limited examination. Additional workup when clinically appropriate Electronically Signed   By: Jill Side M.D.   On: 08/28/2022 12:08    Assessment & Plan: 77 year old male with PMH BPH with prostatomegaly s/p apparent outlet procedure about 8 years ago, gross hematuria with a 40-pack-year smoking history, CAD, and CHF admitted with gross hematuria with a 6 point hemoglobin drop over the past 3 days and clot retention, now on CBI.  Gross hematuria has significantly improved to resolved.  Expect slight drop in hemoglobin since yesterday is dilutional with no evidence of clinically significant ongoing urologic bleeding at this time.  I turned his CBI down to slow drip and will revisit at midday.  If his efflux remains light pink or clear in color, will clamp CBI.  If efflux remains light pink or clear in color on clamp CBI by the end of the day, will discontinue CBI.  We discussed that he will require outpatient follow-up with his primary urologist, Dr. Valma Cava, and I anticipate he will require cystoscopy for further evaluation of the source of his bleeding.  We discussed that the most likely etiology of his gross hematuria is BPH, however bladder tumor is not excluded.  We discussed restarting finasteride during his current admission to reduce his risk for prostatic bleeding, but we discussed that this would take several months of continuous therapy.  They would like to start pharmacotherapy.  We discussed the risk of ED on finasteride and  are in agreement with pursuing this.  Recommendations: -No plans for OR today, he may have a diet -Keep CBI on slow drip this morning and titrate if necessary due to worsening gross hematuria -I will revisit at midday -Continue to trend CBC -Start finasteride 5 mg daily -Outpatient follow-up with Dr. Kaylyn Layer, PA-C 08/29/2022

## 2022-08-29 NOTE — Assessment & Plan Note (Addendum)
Blood pressure currently within goal. -Keeping home Cozaar on hold due to AKI -We can restart metoprolol if needed -IV hydralazine as needed

## 2022-08-29 NOTE — Hospital Course (Addendum)
Taken from H&P.  Casey Murphy is a 77 y.o. male with medical history significant of BPH, hematuria, HTN, HLD, CAD, sCHF (2d echo on 06/29/17 with EF 25 - 30%, the repeated EF on 07/02/17 showed ef > 55%), presents with hematuria, with associated lightheadedness and dizziness.   Pt states that he has approximate 3-4 month history of intermittent painless gross hematuria. Patient was seen in ED on 3/17 due to hematuria and was discharged on Keflex for UTI.  Pt was found to have urinary retention with 400 cc by bladder scan in ED. CBI is started in ED. Hgb dropped from 14.6 on 3/17 to 8.8.  Data reviewed independently and ED Course: pt was found to have WBC 13.4, Hgb 8.8 (14.6 on 08/25/22), Urinalysis with clear appearance, negative leukocyte, rare bacteria, WBC 11-20, worsening renal function, temperature 99.5, blood pressure 99/51, 107/61, heart rate 83, RR 17, oxygen saturation 100% on room air.  Urology was consulted, and he was started on CBI and Rocephin.  3/21: Vital stable this morning.  Labs with improving renal function, decreasing hemoglobin, this morning at 7.7. Anemia panel with concern of anemia of chronic disease and some iron deficiency.  Starting him on iron supplement.  B12 pending Limited pelvic ultrasound with clots, manual irrigation was also attempted.  Urine started improving and CBI was clamped later in the day.  Patient was also started on finasteride and he will be going home with Foley catheter in place and need to follow-up with his on urologist for further recommendations.  3/22: Patient remained hemodynamically stable.  Hemoglobin at 7.2 this morning, no more active bleeding.  Patient received 1 unit of PRBC and will continue with iron supplement. Urine cultures came back negative so there is no further need of antibiotics. Renal functions continue to improve.  Patient was asked to keep holding home antihypertensives for another day and then resume it.  He can resume his baby  aspirin from tomorrow. Patient should be taking Flomax and finasteride to help with BPH.  Patient is being discharged with Foley catheter in place and need to have a close follow-up with his urologist for further recommendations.  He will continue with rest of his home medications and need to have a close follow-up with his providers.

## 2022-08-29 NOTE — Assessment & Plan Note (Signed)
Continue Zocor 

## 2022-08-29 NOTE — Progress Notes (Signed)
CBI stopped earlier in the day by urology. Urine remains clear/yellow with one small clot that ws reported to [place], [place]. Per Aldona Bar, okay to discontinue CBI and add catheter plug. Patient and wife made aware, verbalized understanding.

## 2022-08-29 NOTE — Assessment & Plan Note (Signed)
-  Continue Zocor -Keep holding aspirin due to hematuria

## 2022-08-29 NOTE — Assessment & Plan Note (Signed)
Most likely the cause of current hematuria and urinary retention. -Started on finasteride

## 2022-08-29 NOTE — Assessment & Plan Note (Signed)
Started improving.  Hemoglobin dropped to 7.7.   Urology clamped the CBI today. -Patient was started on finasteride -Will be going home with Foley catheter in place

## 2022-08-29 NOTE — Assessment & Plan Note (Signed)
Most likely secondary to obstruction.  Improving, creatinine at 1.58 this morning.  Baseline around 1.1 -Monitor renal function -Avoid nephrotoxins

## 2022-08-30 DIAGNOSIS — R310 Gross hematuria: Secondary | ICD-10-CM | POA: Diagnosis not present

## 2022-08-30 DIAGNOSIS — N3289 Other specified disorders of bladder: Secondary | ICD-10-CM | POA: Diagnosis not present

## 2022-08-30 DIAGNOSIS — R319 Hematuria, unspecified: Secondary | ICD-10-CM | POA: Diagnosis not present

## 2022-08-30 LAB — BASIC METABOLIC PANEL
Anion gap: 11 (ref 5–15)
BUN: 17 mg/dL (ref 8–23)
CO2: 23 mmol/L (ref 22–32)
Calcium: 8.8 mg/dL — ABNORMAL LOW (ref 8.9–10.3)
Chloride: 108 mmol/L (ref 98–111)
Creatinine, Ser: 1.27 mg/dL — ABNORMAL HIGH (ref 0.61–1.24)
GFR, Estimated: 58 mL/min — ABNORMAL LOW (ref >60)
Glucose, Bld: 93 mg/dL (ref 70–99)
Potassium: 4.3 mmol/L (ref 3.5–5.1)
Sodium: 142 mmol/L (ref 135–145)

## 2022-08-30 LAB — PREPARE RBC (CROSSMATCH)

## 2022-08-30 LAB — CBC
HCT: 21.5 % — ABNORMAL LOW (ref 39.0–52.0)
Hemoglobin: 7.3 g/dL — ABNORMAL LOW (ref 13.0–17.0)
MCH: 30 pg (ref 26.0–34.0)
MCHC: 34 g/dL (ref 30.0–36.0)
MCV: 88.5 fL (ref 80.0–100.0)
Platelets: 251 10*3/uL (ref 150–400)
RBC: 2.43 MIL/uL — ABNORMAL LOW (ref 4.22–5.81)
RDW: 14.3 % (ref 11.5–15.5)
WBC: 11.4 10*3/uL — ABNORMAL HIGH (ref 4.0–10.5)
nRBC: 0 % (ref 0.0–0.2)

## 2022-08-30 LAB — ABO/RH: ABO/RH(D): O POS

## 2022-08-30 LAB — GLUCOSE, CAPILLARY: Glucose-Capillary: 85 mg/dL (ref 70–99)

## 2022-08-30 LAB — HEMOGLOBIN AND HEMATOCRIT, BLOOD
HCT: 25.2 % — ABNORMAL LOW (ref 39.0–52.0)
Hemoglobin: 8.5 g/dL — ABNORMAL LOW (ref 13.0–17.0)

## 2022-08-30 MED ORDER — SODIUM CHLORIDE 0.9% IV SOLUTION: Freq: Once | INTRAVENOUS | Status: AC

## 2022-08-30 MED ORDER — FERROUS SULFATE 325 (65 FE) MG PO TABS: 325.0000 mg | ORAL_TABLET | Freq: Two times a day (BID) | ORAL | 3 refills | Status: AC

## 2022-08-30 MED ORDER — FINASTERIDE 5 MG PO TABS: 5.0000 mg | ORAL_TABLET | Freq: Every day | ORAL | 1 refills | Status: AC

## 2022-08-30 NOTE — Discharge Summary (Signed)
Physician Discharge Summary   Patient: Casey Murphy MRN: [id number] DOB: [birth year]  Admit date:     08/28/2022  Discharge date: 08/30/22  Discharge Physician: Lorella Nimrod   PCP: Ivor Reining, MD   Recommendations at discharge:  Please obtain CBC and BMP within a week Patient is being discharged with Foley catheter and need to have a close follow-up with urology. Follow-up with primary care provider  Discharge Diagnoses: Principal Problem:   Hematuria Active Problems:   Urinary retention   BPH (benign prostatic hyperplasia)   UTI (urinary tract infection)   AKI (acute kidney injury) ([place])   Acute blood loss anemia   Chronic combined systolic and diastolic CHF (congestive heart failure) (H[facility])   HTN (hypertension)   HLD (hyperlipidemia)   CAD (coronary artery disease)   Hospital Course: Taken from H&P.  Casey Murphy is a 77 y.o. male with medical history significant of BPH, hematuria, HTN, HLD, CAD, sCHF (2d echo on 06/29/17 with EF 25 - 30%, the repeated EF on 07/02/17 showed ef > 55%), presents with hematuria, with associated lightheadedness and dizziness.   Pt states that he has approximate 3-4 month history of intermittent painless gross hematuria. Patient was seen in ED on 3/17 due to hematuria and was discharged on Keflex for UTI.  Pt was found to have urinary retention with 400 cc by bladder scan in ED. CBI is started in ED. Hgb dropped from 14.6 on 3/17 to 8.8.  Data reviewed independently and ED Course: pt was found to have WBC 13.4, Hgb 8.8 (14.6 on 08/25/22), Urinalysis with clear appearance, negative leukocyte, rare bacteria, WBC 11-20, worsening renal function, temperature 99.5, blood pressure 99/51, 107/61, heart rate 83, RR 17, oxygen saturation 100% on room air.  Urology was consulted, and he was started on CBI and Rocephin.  3/21: Vital stable this morning.  Labs with improving renal function, decreasing hemoglobin, this morning at 7.7. Anemia panel with  concern of anemia of chronic disease and some iron deficiency.  Starting him on iron supplement.  B12 pending Limited pelvic ultrasound with clots, manual irrigation was also attempted.  Urine started improving and CBI was clamped later in the day.  Patient was also started on finasteride and he will be going home with Foley catheter in place and need to follow-up with his on urologist for further recommendations.  3/22: Patient remained hemodynamically stable.  Hemoglobin at 7.2 this morning, no more active bleeding.  Patient received 1 unit of PRBC and will continue with iron supplement. Urine cultures came back negative so there is no further need of antibiotics. Renal functions continue to improve.  Patient was asked to keep holding home antihypertensives for another day and then resume it.  He can resume his baby aspirin from tomorrow. Patient should be taking Flomax and finasteride to help with BPH.  Patient is being discharged with Foley catheter in place and need to have a close follow-up with his urologist for further recommendations.  He will continue with rest of his home medications and need to have a close follow-up with his providers.  Assessment and Plan: * Hematuria Started improving.  Hemoglobin dropped to 7.7.   Urology clamped the CBI today. -Patient was started on finasteride -Will be going home with Foley catheter in place  Urinary retention S/p Foley catheter in place. Patient will be discharged on Foley catheter and need to have follow-up his own urologist for further management  BPH (benign prostatic hyperplasia) Most likely the cause of current hematuria  and urinary retention. -Started on finasteride  UTI (urinary tract infection) -Continue with Rocephin -Follow up final urine culture results  AKI (acute kidney injury) ([place]) Most likely secondary to obstruction.  Improving, creatinine at 1.58 this morning.  Baseline around 1.1 -Monitor renal function -Avoid  nephrotoxins  Acute blood loss anemia Secondary to hematuria.  Hemoglobin at 7.7 this morning Anemia panel with anemia of chronic disease and some iron deficiency.  B12 pending. -Start him on iron supplement -Monitor hemoglobin  Chronic combined systolic and diastolic CHF (congestive heart failure) ([place]) 2D echo 06/30/2015 showed EF of 25-30% with grade 1 diastolic function, but the repeated 2D echo on 07/03/2027 showed EF > 55%.  BNP 34 today.  No leg edema.  CHF is compensated. -Watch volume status closely  HTN (hypertension) Blood pressure currently within goal. -Keeping home Cozaar on hold due to AKI -We can restart metoprolol if needed -IV hydralazine as needed  HLD (hyperlipidemia) -Continue Zocor  CAD (coronary artery disease) -Continue Zocor -Keep holding aspirin due to hematuria   Consultants: Urology Procedures performed: CBI Disposition: Home Diet recommendation:  Discharge Diet Orders (From admission, onward)     Start     Ordered   08/30/22 0000  Diet - low sodium heart healthy        08/30/22 1208           Cardiac diet DISCHARGE MEDICATION: Allergies as of 08/30/2022       Reactions   Bactrim [sulfamethoxazole-trimethoprim] Hives, Swelling   Lisinopril Swelling   Sulfa Antibiotics Hives, Swelling        Medication List     STOP taking these medications    cephALEXin 500 MG capsule Commonly known as: KEFLEX   doxycycline 100 MG capsule Commonly known as: VIBRAMYCIN   loperamide 2 MG capsule Commonly known as: IMODIUM   mupirocin cream 2 % Commonly known as: Bactroban   valACYclovir 1000 MG tablet Commonly known as: VALTREX       TAKE these medications    acetaminophen 500 MG tablet Commonly known as: TYLENOL Take 500 mg by mouth every 6 (six) hours as needed for mild pain, moderate pain, fever or headache.   aspirin EC 81 MG tablet Take 81 mg by mouth daily.   feeding supplement Liqd Take 237 mLs by mouth 3 (three) times  daily between meals.   ferrous sulfate 325 (65 FE) MG tablet Take 1 tablet (325 mg total) by mouth 2 (two) times daily with a meal.   finasteride 5 MG tablet Commonly known as: PROSCAR Take 1 tablet (5 mg total) by mouth daily. Start taking on: August 31, 2022   losartan 100 MG tablet Commonly known as: COZAAR Take 100 mg by mouth daily.   metoprolol succinate 50 MG 24 hr tablet Commonly known as: TOPROL-XL Take 50 mg by mouth daily.   multivitamin with minerals Tabs tablet Take 1 tablet by mouth daily.   oxyCODONE 5 MG immediate release tablet Commonly known as: Roxicodone Take 1 tablet (5 mg total) by mouth every 6 (six) hours as needed for up to 6 doses for severe pain or breakthrough pain.   simvastatin 40 MG tablet Commonly known as: ZOCOR Take 40 mg by mouth at bedtime.   tamsulosin 0.4 MG Caps capsule Commonly known as: FLOMAX Take 0.4 mg by mouth daily.        Discharge Exam: Filed Weights   08/28/22 0543 08/29/22 0241 08/30/22 0500  Weight: 93.6 kg 90.6 kg 92.8 kg  General.  Well-developed gentleman, in no acute distress. Pulmonary.  Lungs clear bilaterally, normal respiratory effort. CV.  Regular rate and rhythm, no JVD, rub or murmur. Abdomen.  Soft, nontender, nondistended, BS positive. CNS.  Alert and oriented .  No focal neurologic deficit. Extremities.  No edema, no cyanosis, pulses intact and symmetrical. Psychiatry.  Judgment and insight appears normal.   Condition at discharge: stable  The results of significant diagnostics from this hospitalization (including imaging, microbiology, ancillary and laboratory) are listed below for reference.   Imaging Studies: US PELVIS LIMITED (TRANSABDOMINAL ONLY)  Result Date: 08/28/2022 CLINICAL DATA:  Hematuria. EXAM: LIMITED ULTRASOUND OF PELVIS TECHNIQUE: Limited transabdominal ultrasound examination of the pelvis was performed. COMPARISON:  Noncontrast CT 06/29/2015 FINDINGS: Limited examination of the  bladder with specifically performed. There is a Foley catheter in place. The bladder has a irregular thickened wall and there is extensive luminal debris within the bladder. Possibilities include hematoma with the patient's clinical presentation. A mass lesion in principle is not excluded. There is also a very large prostate which is not well measured today. On prior CT scan of the prostate had dimensions approaching up to 8.5 cm. Additional workup when clinically appropriate. IMPRESSION: Foley catheter in the relatively contracted bladder. The bladder wall is diffusely thickened and there is some intraluminal debris. In addition there appears to be a very large prostate but not well measured on this limited examination. Additional workup when clinically appropriate Electronically Signed   By: Jill Side M.D.   On: 08/28/2022 12:08   DG Chest 2 View  Result Date: 08/03/2022 CLINICAL DATA:  Chest  pain. EXAM: CHEST - 2 VIEW COMPARISON:  PA lateral 07/04/2015. FINDINGS: The heart size and mediastinal contours are within normal limits. Both lungs are clear except for linear atelectasis in the left base. The visualized skeletal structures are unremarkable. IMPRESSION: Linear atelectasis in the left base. No evidence of acute chest disease. Electronically Signed   By: Telford Nab M.D.   On: 08/03/2022 04:09    Microbiology: Results for orders placed or performed during the hospital encounter of 08/28/22  Culture, blood (x 2)     Status: None (Preliminary result)   Collection Time: 08/28/22  8:30 AM   Specimen: BLOOD  Result Value Ref Range Status   Specimen Description BLOOD RIGHT [facility]  Final   Special Requests   Final    BOTTLES DRAWN AEROBIC AND ANAEROBIC Blood Culture adequate volume   Culture   Final    NO GROWTH 2 DAYS Performed at [facility], [address], [place], [place] [address]    Report Status PENDING  Incomplete  Culture, blood (x 2)     Status: None (Preliminary result)    Collection Time: 08/28/22  8:36 AM   Specimen: BLOOD  Result Value Ref Range Status   Specimen Description BLOOD  RIGHT HAND  Final   Special Requests   Final    BOTTLES DRAWN AEROBIC AND ANAEROBIC Blood Culture adequate volume   Culture   Final    NO GROWTH 2 DAYS Performed at [facility], [address], [place], [place] [address]    Report Status PENDING  Incomplete  Urine Culture (for pregnant, neutropenic or urologic patients or patients with an indwelling urinary catheter)     Status: None   Collection Time: 08/28/22  1:21 PM   Specimen: Urine, Clean Catch  Result Value Ref Range Status   Specimen Description   Final    URINE,  CLEAN CATCH Performed at [facility], [address] [place], [place] [address]    Special Requests   Final    NONE Performed at [facility], [address] [place], [place] [address]    Culture   Final    NO GROWTH Performed at [facility], [place] [address] [place], [place] [address]    Report Status 08/29/2022 FINAL  Final    Labs: CBC: Recent Labs  Lab 08/25/22 0859 08/28/22 0539 08/28/22 1441 08/28/22 2025 08/29/22 0206 08/29/22 1036 08/30/22 0528  WBC 14.7* 13.4* 12.7* 12.6* 12.8* 12.7* 11.4*  NEUTROABS 11.8* 9.5*  --   --   --   --   --   HGB 14.6 8.8* 8.2* 8.2* 7.7* 8.0* 7.3*  HCT 43.8 26.2* 24.6* 24.7* 23.3* 23.9* 21.5*  MCV 88.8 89.4 90.1 90.1 90.3 90.2 88.5  PLT 259 210 220 204 212 219 [id number]   Basic Metabolic Panel: Recent Labs  Lab 08/25/22 0859 08/28/22 0539 08/29/22 0206 08/30/22 0528  NA 130* 134* 140 142  K 4.1 4.9 4.3 4.3  CL 103 104 110 108  CO2 20* 20* 23 23  GLUCOSE 124* 128* 107* 93  BUN 27* 44* 29* 17  CREATININE 1.45* 2.48* 1.58* 1.27*  CALCIUM 9.0 8.6* 8.4* 8.8*   Liver Function Tests: No results for input(s): "AST", "ALT", "ALKPHOS", "BILITOT", "PROT", "ALBUMIN" in the last 168 hours. CBG: Recent Labs  Lab 08/29/22 0751 08/30/22 0921  GLUCAP 100* 85     Discharge time spent: greater than 30 minutes.  This record has been created using Systems analyst. Errors have been sought and corrected,but may not always be located. Such creation errors do not reflect on the standard of care.   Signed: Lorella Nimrod, MD [facility] 08/30/2022

## 2022-08-30 NOTE — Care Management Important Message (Signed)
Important Message  Patient Details  Name: Casey Murphy MRN: [id number] Date of Birth: 1945/12/11   Medicare Important Message Given:  Yes     Dannette Barbara 08/30/2022, 11:08 AM

## 2022-08-30 NOTE — Progress Notes (Signed)
Patient discharged to home, reviewed AVS including meds, follow up appointments and foley care. Provided standard drainage bag and leg bag for the patient. IV removed. NT assisted patient to the exit. Family provided transportation.

## 2022-08-30 NOTE — Progress Notes (Signed)
Urology Inpatient Progress Note  Subjective: No acute events overnight.  He is afebrile, VSS. Hemoglobin slightly down today, 7.3. Foley catheter in place draining clear, yellow urine off CBI. He denies pain or acute concerns this morning.  Anti-infectives: Anti-infectives (From admission, onward)    Start     Dose/Rate Route Frequency Ordered Stop   08/28/22 0800  cefTRIAXone (ROCEPHIN) 1 g in sodium chloride 0.9 % 100 mL IVPB        1 g 200 mL/hr over 30 Minutes Intravenous Every 24 hours 08/28/22 0746         Current Facility-Administered Medications  Medication Dose Route Frequency Provider Last Rate Last Admin   0.9 %  sodium chloride infusion   Intravenous Continuous Ivor Costa, MD 75 mL/hr at 08/30/22 0713 New Bag at 08/30/22 0713   acetaminophen (TYLENOL) tablet 650 mg  650 mg Oral Q6H PRN Ivor Costa, MD       cefTRIAXone (ROCEPHIN) 1 g in sodium chloride 0.9 % 100 mL IVPB  1 g Intravenous Q24H Ivor Costa, MD   Stopped at 08/29/22 [id number]   Chlorhexidine Gluconate Cloth 2 % PADS 6 each  6 each Topical Daily Lorella Nimrod, MD   6 each at 08/29/22 1055   ferrous sulfate tablet 325 mg  325 mg Oral BID WC Lorella Nimrod, MD   325 mg at 08/29/22 1653   finasteride (PROSCAR) tablet 5 mg  5 mg Oral Daily Sharie Amorin, PA-C   5 mg at 08/29/22 1113   hydrALAZINE (APRESOLINE) injection 5 mg  5 mg Intravenous Q2H PRN Ivor Costa, MD       multivitamin with minerals tablet 1 tablet  1 tablet Oral Daily Ivor Costa, MD   1 tablet at 08/29/22 0912   ondansetron (ZOFRAN) injection 4 mg  4 mg Intravenous Q8H PRN Ivor Costa, MD       oxyCODONE (Oxy IR/ROXICODONE) immediate release tablet 5 mg  5 mg Oral Q6H PRN Ivor Costa, MD       simvastatin (ZOCOR) tablet 40 mg  40 mg Oral QHS Ivor Costa, MD   40 mg at 08/29/22 2145   tamsulosin (FLOMAX) capsule 0.4 mg  0.4 mg Oral Daily Ivor Costa, MD   0.4 mg at 08/29/22 0912   Objective: Vital signs in last 24 hours: Temp:  [97.9 F (36.6  C)-98.6 F (37 C)] 98.6 F (37 C) (03/22 0345) Pulse Rate:  [76-95] 95 (03/22 0345) Resp:  [16-20] 20 (03/22 0345) BP: (122-124)/(65-72) 124/65 (03/22 0345) SpO2:  [94 %-100 %] 94 % (03/22 0345) Weight:  [92.8 kg] 92.8 kg (03/22 0500)  Intake/Output from previous day: 03/21 0701 - 03/22 0700 In: 1360 [P.O.:360; I.V.:1000] Out: 5300 [Urine:5300] Intake/Output this shift: No intake/output data recorded.  Physical Exam Vitals and nursing note reviewed.  Constitutional:      General: He is not in acute distress.    Appearance: He is not ill-appearing, toxic-appearing or diaphoretic.  HENT:     Head: Normocephalic and atraumatic.  Pulmonary:     Effort: Pulmonary effort is normal. No respiratory distress.  Skin:    General: Skin is warm and dry.  Neurological:     Mental Status: He is alert and oriented to person, place, and time.  Psychiatric:        Mood and Affect: Mood normal.        Behavior: Behavior normal.     Lab Results:  Recent Labs    08/29/22 1036 08/30/22 0528  WBC  12.7* 11.4*  HGB 8.0* 7.3*  HCT 23.9* 21.5*  PLT 219 251   BMET Recent Labs    08/29/22 0206 08/30/22 0528  NA 140 142  K 4.3 4.3  CL 110 108  CO2 23 23  GLUCOSE 107* 93  BUN 29* 17  CREATININE 1.58* 1.27*  CALCIUM 8.4* 8.8*   PT/INR Recent Labs    08/28/22 0539  LABPROT 14.3  INR 1.1   Studies/Results: US PELVIS LIMITED (TRANSABDOMINAL ONLY)  Result Date: 08/28/2022 CLINICAL DATA:  Hematuria. EXAM: LIMITED ULTRASOUND OF PELVIS TECHNIQUE: Limited transabdominal ultrasound examination of the pelvis was performed. COMPARISON:  Noncontrast CT 06/29/2015 FINDINGS: Limited examination of the bladder with specifically performed. There is a Foley catheter in place. The bladder has a irregular thickened wall and there is extensive luminal debris within the bladder. Possibilities include hematoma with the patient's clinical presentation. A mass lesion in principle is not excluded. There  is also a very large prostate which is not well measured today. On prior CT scan of the prostate had dimensions approaching up to 8.5 cm. Additional workup when clinically appropriate. IMPRESSION: Foley catheter in the relatively contracted bladder. The bladder wall is diffusely thickened and there is some intraluminal debris. In addition there appears to be a very large prostate but not well measured on this limited examination. Additional workup when clinically appropriate Electronically Signed   By: Jill Side M.D.   On: 08/28/2022 12:08     Assessment & Plan: 77 year old male with PMH BPH with prostatomegaly s/p apparent outlet procedure about 8 years ago, gross hematuria with a 40-pack-year smoking history, CAD, and CHF admitted with gross hematuria with a 6 point hemoglobin drop over the last 3 days and clot retention requiring manual Foley irrigation and CBI.  His gross hematuria has resolved and CBI has been discontinued.  His hemoglobin remains borderline for consideration of transfusion.  No evidence of active urologic bleeding at this time.  Finasteride started yesterday.  Recommendations: -Will defer to primary team regarding discharge timing, okay for discharge from the urologic perspective in the absence of ongoing urologic bleeding -Continue finasteride on discharge -Continue Foley catheter on discharge -Outpatient follow-up with Dr. Valma Cava for hematuria workup  Debroah Loop, PA-C 08/30/2022

## 2022-08-31 LAB — TYPE AND SCREEN
ABO/RH(D): O POS
Antibody Screen: NEGATIVE
Unit division: 0

## 2022-08-31 LAB — BPAM RBC
Blood Product Expiration Date: 202404262359
ISSUE DATE / TIME: 202403221122
Unit Type and Rh: 5100

## 2022-09-02 LAB — CULTURE, BLOOD (ROUTINE X 2)
Culture: NO GROWTH
Culture: NO GROWTH
Special Requests: ADEQUATE
Special Requests: ADEQUATE
